# Patient Record
Sex: Female | Born: 1990 | Hispanic: Yes | Smoking: Never smoker
Health system: Southern US, Community
[De-identification: ages and names within clinical notes are randomized; demographics above are authoritative.]

## PROBLEM LIST (undated history)

## (undated) ENCOUNTER — Ambulatory Visit (HOSPITAL_COMMUNITY): Admission: EM | Payer: PRIVATE HEALTH INSURANCE

---

## 2016-01-13 ENCOUNTER — Ambulatory Visit (INDEPENDENT_AMBULATORY_CARE_PROVIDER_SITE_OTHER): Payer: PRIVATE HEALTH INSURANCE | Admitting: Family Medicine

## 2016-01-13 VITALS — BP 118/72 | HR 85 | Temp 98.1°F | Resp 18 | Ht 61.0 in | Wt 150.0 lb

## 2016-01-13 DIAGNOSIS — R300 Dysuria: Secondary | ICD-10-CM | POA: Diagnosis not present

## 2016-01-13 DIAGNOSIS — L298 Other pruritus: Secondary | ICD-10-CM

## 2016-01-13 DIAGNOSIS — N898 Other specified noninflammatory disorders of vagina: Secondary | ICD-10-CM

## 2016-01-13 DIAGNOSIS — Z7251 High risk heterosexual behavior: Secondary | ICD-10-CM | POA: Diagnosis not present

## 2016-01-13 DIAGNOSIS — Z113 Encounter for screening for infections with a predominantly sexual mode of transmission: Secondary | ICD-10-CM | POA: Diagnosis not present

## 2016-01-13 DIAGNOSIS — A599 Trichomoniasis, unspecified: Secondary | ICD-10-CM | POA: Diagnosis not present

## 2016-01-13 LAB — POCT URINALYSIS DIP (MANUAL ENTRY)
Bilirubin, UA: NEGATIVE
Glucose, UA: NEGATIVE
Ketones, POC UA: NEGATIVE
NITRITE UA: NEGATIVE
PROTEIN UA: NEGATIVE
Spec Grav, UA: >=1.03
UROBILINOGEN UA: 0.2
pH, UA: 5.5

## 2016-01-13 LAB — POC MICROSCOPIC URINALYSIS (UMFC): MUCUS RE: ABSENT

## 2016-01-13 LAB — POCT WET + KOH PREP
Yeast by KOH: ABSENT
Yeast by wet prep: ABSENT

## 2016-01-13 LAB — HIV ANTIBODY (ROUTINE TESTING W REFLEX): HIV 1&2 Ab, 4th Generation: NONREACTIVE

## 2016-01-13 LAB — HEPATITIS C ANTIBODY: HCV Ab: NEGATIVE

## 2016-01-13 LAB — HEPATITIS B SURFACE ANTIGEN: Hepatitis B Surface Ag: NEGATIVE

## 2016-01-13 LAB — HEPATITIS B SURFACE ANTIBODY, QUANTITATIVE: Hepatitis B-Post: 0.6 m[IU]/mL

## 2016-01-13 MED ORDER — NITROFURANTOIN MONOHYD MACRO 100 MG PO CAPS: 100.0000 mg | ORAL_CAPSULE | Freq: Two times a day (BID) | ORAL | Status: DC

## 2016-01-13 MED ORDER — METRONIDAZOLE 500 MG PO TABS: 2000.0000 mg | ORAL_TABLET | Freq: Once | ORAL | Status: DC

## 2016-01-13 NOTE — Progress Notes (Signed)
Pelvic exam and bimanual performed.  Normal female external genitalia without lesion.  Vaginal mucosa appear normal, moist and without lesions. Moderate amount of white discharge noted in vaginal canal. Cervix with no lesions, not friable. Wet prep and GC probed collected. No cervical motion tenderness, adnexal fullness or tenderness.  Benjiman CoreBrittany Joel Mericle, PA-C  [facility] 01/13/2016 11:28 AM

## 2016-01-13 NOTE — Patient Instructions (Addendum)
IF you received an x-ray today, you will receive an invoice from [facility]. Please contact [facility] at [phone] with questions or concerns regarding your invoice.   IF you received labwork today, you will receive an invoice from [facility]. Please contact [facility] at [phone] with questions or concerns regarding your invoice.   Our billing staff will not be able to assist you with questions regarding bills from these companies.  You will be contacted with the lab results as soon as they are available. The fastest way to get your results is to activate your My Chart account. Instructions are located on the last page of this paperwork. If you have not heard from us regarding the results in 2 weeks, please contact this office.     You do not appear to have a possible urinary tract infection, but the other test did indicate Trichomonas. I will treat for both at this time, but will check a urine culture to make sure. If the urine culture does not indicate an infection, you could stop the Macrobid. In the meantime, make sure your partner gets tested or treated for trichomonas, and we will let you know the results of the other STI testing as soon as we have them.  Return to the clinic or go to the nearest emergency room if any of your symptoms worsen or new symptoms occur.   Trichomoniasis Trichomoniasis is an infection caused by an organism called Trichomonas. The infection can affect both women and men. In women, the outer female genitalia and the vagina are affected. In men, the penis is mainly affected, but the prostate and other reproductive organs can also be involved. Trichomoniasis is a sexually transmitted infection (STI) and is most often passed to another person through sexual contact.  RISK FACTORS  Having unprotected sexual intercourse.  Having sexual intercourse with an infected partner. SIGNS AND SYMPTOMS  Symptoms of  trichomoniasis in women include:  Abnormal gray-green frothy vaginal discharge.  Itching and irritation of the vagina.  Itching and irritation of the area outside the vagina. Symptoms of trichomoniasis in men include:   Penile discharge with or without pain.  Pain during urination. This results from inflammation of the urethra. DIAGNOSIS  Trichomoniasis may be found during a Pap test or physical exam. Your health care provider may use one of the following methods to help diagnose this infection:  Testing the pH of the vagina with a test tape.  Using a vaginal swab test that checks for the Trichomonas organism. A test is available that provides results within a few minutes.  Examining a urine sample.  Testing vaginal secretions. Your health care provider may test you for other STIs, including HIV. TREATMENT   You may be given medicine to fight the infection. Women should inform their health care provider if they could be or are pregnant. Some medicines used to treat the infection should not be taken during pregnancy.  Your health care provider may recommend over-the-counter medicines or creams to decrease itching or irritation.  Your sexual partner will need to be treated if infected.  Your health care provider may test you for infection again 3 months after treatment. HOME CARE INSTRUCTIONS   Take medicines only as directed by your health care provider.  Take over-the-counter medicine for itching or irritation as directed by your health care provider.  Do not have sexual intercourse while you have the infection.  Women should not douche or wear tampons while they  have the infection.  Discuss your infection with your partner. Your partner may have gotten the infection from you, or you may have gotten it from your partner.  Have your sex partner get examined and treated if necessary.  Practice safe, informed, and protected sex.  See your health care provider for other  STI testing. SEEK MEDICAL CARE IF:   You still have symptoms after you finish your medicine.  You develop abdominal pain.  You have pain when you urinate.  You have bleeding after sexual intercourse.  You develop a rash.  Your medicine makes you sick or makes you throw up (vomit). MAKE SURE YOU:  Understand these instructions.  Will watch your condition.  Will get help right away if you are not doing well or get worse.   This information is not intended to replace advice given to you by your health care provider. Make sure you discuss any questions you have with your health care provider.   Document Released: 12/09/2000 Document Revised: 07/06/2014 Document Reviewed: 03/27/2013 Elsevier Interactive Patient Education 2016 [employer].  Urethritis, Adult Urethritis is an inflammation of the tube through which urine exits your bladder (urethra).  CAUSES Urethritis is often caused by an infection in your urethra. The infection can be viral, like herpes. The infection can also be bacterial, like gonorrhea. RISK FACTORS Risk factors of urethritis include:  Having sex without using a condom.  Having multiple sexual partners.  Having poor hygiene. SIGNS AND SYMPTOMS Symptoms of urethritis are less noticeable in women than in men. These symptoms include:  Burning feeling when you urinate (dysuria).  Discharge from your urethra.  Blood in your urine (hematuria).  Urinating more than usual. DIAGNOSIS  To confirm a diagnosis of urethritis, your health care provider will do the following:  Ask about your sexual history.  Perform a physical exam.  Have you provide a sample of your urine for lab testing.  Use a cotton swab to gently collect a sample from your urethra for lab testing. TREATMENT  It is important to treat urethritis. Depending on the cause, untreated urethritis may lead to serious genital infections and possibly infertility. Urethritis caused by a  bacterial infection is treated with antibiotic medicine. All sexual partners must be treated.  HOME CARE INSTRUCTIONS  Do not have sex until the test results are known and treatment is completed, even if your symptoms go away before you finish treatment.  If you were prescribed an antibiotic, finish it all even if you start to feel better. SEEK MEDICAL CARE IF:   Your symptoms are not improved in 3 days.  Your symptoms are getting worse.  You develop abdominal pain or pelvic pain (in women).  You develop joint pain.  You have a fever. SEEK IMMEDIATE MEDICAL CARE IF:   You have severe pain in the belly, back, or side.  You have repeated vomiting. MAKE SURE YOU:  Understand these instructions.  Will watch your condition.  Will get help right away if you are not doing well or get worse.   This information is not intended to replace advice given to you by your health care provider. Make sure you discuss any questions you have with your health care provider.   Document Released: 12/09/2000 Document Revised: 10/30/2014 Document Reviewed: 02/13/2013 Elsevier Interactive Patient Education [employer]

## 2016-01-13 NOTE — Progress Notes (Signed)
By signing my name below I, Shelah Lewandowsky, attest that this documentation has been prepared under the direction and in the presence of Shade Flood, MD. Electonically Signed. Shelah Lewandowsky, Scribe 01/13/2016 at 12:23 PM   Subjective:    Patient ID: Julie Levy, female    DOB: [birth year], 25 y.o.   MRN: [id number]  Chief Complaint  Patient presents with  . Vaginal Itching    x 2-3 days ago, had unprotected sex x 1 month ago, itchying worsen yesterday, would like STD testing     HPI Julie Levy is a 25 y.o. female who [facility] complaining of worsening vaginal itching for the past 3 days. Pt states the itching worsened to the point where it was waking her up last night from sleep. Pt also reports having mild vaginal discharge and thinks it is due to the fact that she is ovulating. Pt also reports burning with urination. Pt denies urinary frequency or urinary urgency. Pt reports having unprotected sex 1 month ago. Pt is requesting STD testing. Pt states her partner that she had unprotected sex with has not informed her of any dx of STIs. Pt reports being tested for STIs 2 months ago and was negative at that time.   Pt tried using ice to control vaginal itching with no relief. Pt also tried a vaginal cleanser last night and thinks it made the itching worse.   Pt denies any external rash, blister or bumps around vaginal area. Pt also denies any abd pain, fever or chills.   There are no active problems to display for this patient.  History reviewed. No pertinent past medical history. History reviewed. No pertinent past surgical history. No Known Allergies Prior to Admission medications   Not on File   Social History   Social History  . Marital Status: Single    Spouse Name: N/A  . Number of Children: N/A  . Years of Education: N/A   Occupational History  . Not on file.   Social History Main Topics  . Smoking status: Never Smoker   .  Smokeless tobacco: Not on file  . Alcohol Use: Not on file  . Drug Use: Not on file  . Sexual Activity: Not on file   Other Topics Concern  . Not on file   Social History Narrative  . No narrative on file      Review of Systems  Constitutional: Negative for fever and chills.  Gastrointestinal: Negative for abdominal pain.  Genitourinary: Positive for dysuria, vaginal discharge and vaginal pain (itching). Negative for vaginal bleeding.       Objective:   Physical Exam  Constitutional: She is oriented to person, place, and time. She appears well-developed and well-nourished.  HENT:  Head: Normocephalic and atraumatic.  Eyes: Conjunctivae and EOM are normal. Pupils are equal, round, and reactive to light.  Neck: Carotid bruit is not present.  Cardiovascular: Normal rate, regular rhythm, normal heart sounds and intact distal pulses.  Exam reveals no gallop and no friction rub.   No murmur heard. Pulmonary/Chest: Effort normal and breath sounds normal. She has no decreased breath sounds. She has no wheezes. She has no rhonchi. She has no rales.  Abdominal: Soft. Normal appearance and bowel sounds are normal. She exhibits no pulsatile midline mass. There is no tenderness. There is no rebound, no guarding and no CVA tenderness.  Neurological: She is alert and oriented to person, place, and time.  Skin: Skin is  warm and dry.  Psychiatric: She has a normal mood and affect. Her behavior is normal.  Vitals reviewed.    Filed Vitals:   01/13/16 1042  BP: 118/72  Pulse: 85  Temp: 98.1 F (36.7 C)  TempSrc: Oral  Resp: 18  Height: 5\' 1"  (1.549 m)  Weight: 150 lb (68.04 kg)  SpO2: 99%   Results for orders placed or performed in visit on 01/13/16  POCT urinalysis dipstick  Result Value Ref Range   Color, UA yellow yellow   Clarity, UA clear clear   Glucose, UA negative negative   Bilirubin, UA negative negative   Ketones, POC UA negative negative   Spec Grav, UA >=1.030     Blood, UA trace-lysed (A) negative   pH, UA 5.5    Protein Ur, POC negative negative   Urobilinogen, UA 0.2    Nitrite, UA Negative Negative   Leukocytes, UA moderate (2+) (A) Negative  POCT Microscopic Urinalysis ([facility])  Result Value Ref Range   WBC,UR,HPF,POC Many (A) None WBC/hpf   RBC,UR,HPF,POC None None RBC/hpf   Bacteria Few (A) None, Too numerous to count   Mucus Absent Absent   Epithelial Cells, UR Per Microscopy Few (A) None, Too numerous to count cells/hpf  POCT Wet + KOH Prep  Result Value Ref Range   Yeast by KOH Absent Present, Absent   Yeast by wet prep Absent Present, Absent   WBC by wet prep Too numerous to count  None, Few, Too numerous to count   Clue Cells Wet Prep HPF POC None None, Too numerous to count   Trich by wet prep Present Present, Absent   Bacteria Wet Prep HPF POC Moderate (A) None, Few, Too numerous to count   Epithelial Cells By [employer]et Pref ([facility]) Moderate (A) None, Few, Too numerous to count   RBC,UR,HPF,POC None None RBC/hpf        Assessment & Plan:   Julie Levy is a 25 y.o. female Vaginal itching - Plan: POCT urinalysis dipstick, POCT Microscopic Urinalysis ([facility]), POCT Wet + KOH Prep  History of unprotected sex - Plan: POCT Wet + KOH Prep, HIV antibody, RPR, Hepatitis B surface antibody, Hepatitis B surface antigen, Hepatitis C antibody, GC/Chlamydia Probe Amp  Dysuria - Plan: POCT Wet + KOH Prep, Urine culture, nitrofurantoin, macrocrystal-monohydrate, (MACROBID) 100 MG capsule  Routine screening for STI (sexually transmitted infection) - Plan: POCT Wet + KOH Prep, HIV antibody, RPR, Hepatitis B surface antibody, Hepatitis B surface antigen, Hepatitis C antibody, GC/Chlamydia Probe Amp  Trichomonas infection - Plan: metroNIDAZOLE (FLAGYL) 500 MG tablet  Trichomonas infection, suspected Trichomonas urethritis, but will also cover for other causes of UTI in the interim.   -Check urine culture  -start Flagyl 2000 mg 1, side effects  discussed. Discussed need for partner to be treated as well.   -Other STI testing performed as above. Safer sex practices discussed. RTC Precautions  Meds ordered this encounter  Medications  . nitrofurantoin, macrocrystal-monohydrate, (MACROBID) 100 MG capsule    Sig: Take 1 capsule (100 mg total) by mouth 2 (two) times daily.    Dispense:  14 capsule    Refill:  0  . metroNIDAZOLE (FLAGYL) 500 MG tablet    Sig: Take 4 tablets (2,000 mg total) by mouth once.    Dispense:  4 tablet    Refill:  0   Patient Instructions       IF you received an x-ray today, you will receive an invoice from [facility]. Please  contact [facility] at [phone] with questions or concerns regarding your invoice.   IF you received labwork today, you will receive an invoice from [facility]. Please contact [facility] at [phone] with questions or concerns regarding your invoice.   Our billing staff will not be able to assist you with questions regarding bills from these companies.  You will be contacted with the lab results as soon as they are available. The fastest way to get your results is to activate your My Chart account. Instructions are located on the last page of this paperwork. If you have not heard from [place] regarding the results in 2 weeks, please contact this office.     You do not appear to have a possible urinary tract infection, but the other test did indicate Trichomonas. I will treat for both at this time, but will check a urine culture to make sure. If the urine culture does not indicate an infection, you could stop the Macrobid. In the meantime, make sure your partner gets tested or treated for trichomonas, and we will let you know the results of the other STI testing as soon as we have them.  Return to the clinic or go to the nearest emergency room if any of your symptoms worsen or new symptoms occur.   Trichomoniasis Trichomoniasis is an  infection caused by an organism called Trichomonas. The infection can affect both women and men. In women, the outer female genitalia and the vagina are affected. In men, the penis is mainly affected, but the prostate and other reproductive organs can also be involved. Trichomoniasis is a sexually transmitted infection (STI) and is most often passed to another person through sexual contact.  RISK FACTORS  Having unprotected sexual intercourse.  Having sexual intercourse with an infected partner. SIGNS AND SYMPTOMS  Symptoms of trichomoniasis in women include:  Abnormal gray-green frothy vaginal discharge.  Itching and irritation of the vagina.  Itching and irritation of the area outside the vagina. Symptoms of trichomoniasis in men include:   Penile discharge with or without pain.  Pain during urination. This results from inflammation of the urethra. DIAGNOSIS  Trichomoniasis may be found during a Pap test or physical exam. Your health care provider may use one of the following methods to help diagnose this infection:  Testing the pH of the vagina with a test tape.  Using a vaginal swab test that checks for the Trichomonas organism. A test is available that provides results within a few minutes.  Examining a urine sample.  Testing vaginal secretions. Your health care provider may test you for other STIs, including HIV. TREATMENT   You may be given medicine to fight the infection. Women should inform their health care provider if they could be or are pregnant. Some medicines used to treat the infection should not be taken during pregnancy.  Your health care provider may recommend over-the-counter medicines or creams to decrease itching or irritation.  Your sexual partner will need to be treated if infected.  Your health care provider may test you for infection again 3 months after treatment. HOME CARE INSTRUCTIONS   Take medicines only as directed by your health care  provider.  Take over-the-counter medicine for itching or irritation as directed by your health care provider.  Do not have sexual intercourse while you have the infection.  Women should not douche or wear tampons while they have the infection.  Discuss your infection with your partner. Your partner may have gotten the infection  from you, or you may have gotten it from your partner.  Have your sex partner get examined and treated if necessary.  Practice safe, informed, and protected sex.  See your health care provider for other STI testing. SEEK MEDICAL CARE IF:   You still have symptoms after you finish your medicine.  You develop abdominal pain.  You have pain when you urinate.  You have bleeding after sexual intercourse.  You develop a rash.  Your medicine makes you sick or makes you throw up (vomit). MAKE SURE YOU:  Understand these instructions.  Will watch your condition.  Will get help right away if you are not doing well or get worse.   This information is not intended to replace advice given to you by your health care provider. Make sure you discuss any questions you have with your health care provider.   Document Released: 12/09/2000 Document Revised: 07/06/2014 Document Reviewed: 03/27/2013 Elsevier Interactive Patient Education 2016 [employer].  Urethritis, Adult Urethritis is an inflammation of the tube through which urine exits your bladder (urethra).  CAUSES Urethritis is often caused by an infection in your urethra. The infection can be viral, like herpes. The infection can also be bacterial, like gonorrhea. RISK FACTORS Risk factors of urethritis include:  Having sex without using a condom.  Having multiple sexual partners.  Having poor hygiene. SIGNS AND SYMPTOMS Symptoms of urethritis are less noticeable in women than in men. These symptoms include:  Burning feeling when you urinate (dysuria).  Discharge from your urethra.  Blood in  your urine (hematuria).  Urinating more than usual. DIAGNOSIS  To confirm a diagnosis of urethritis, your health care provider will do the following:  Ask about your sexual history.  Perform a physical exam.  Have you provide a sample of your urine for lab testing.  Use a cotton swab to gently collect a sample from your urethra for lab testing. TREATMENT  It is important to treat urethritis. Depending on the cause, untreated urethritis may lead to serious genital infections and possibly infertility. Urethritis caused by a bacterial infection is treated with antibiotic medicine. All sexual partners must be treated.  HOME CARE INSTRUCTIONS  Do not have sex until the test results are known and treatment is completed, even if your symptoms go away before you finish treatment.  If you were prescribed an antibiotic, finish it all even if you start to feel better. SEEK MEDICAL CARE IF:   Your symptoms are not improved in 3 days.  Your symptoms are getting worse.  You develop abdominal pain or pelvic pain (in women).  You develop joint pain.  You have a fever. SEEK IMMEDIATE MEDICAL CARE IF:   You have severe pain in the belly, back, or side.  You have repeated vomiting. MAKE SURE YOU:  Understand these instructions.  Will watch your condition.  Will get help right away if you are not doing well or get worse.   This information is not intended to replace advice given to you by your health care provider. Make sure you discuss any questions you have with your health care provider.   Document Released: 12/09/2000 Document Revised: 10/30/2014 Document Reviewed: 02/13/2013 Elsevier Interactive Patient Education [employer]     I personally performed the services described in this documentation, which was scribed in my presence. The recorded information has been reviewed and considered, and addended by me as needed.   Signed,   Meredith Staggers, MD [facility].  01/13/2016 1:32 PM

## 2016-01-14 LAB — GC/CHLAMYDIA PROBE AMP
CT Probe RNA: NOT DETECTED
GC Probe RNA: NOT DETECTED

## 2016-01-14 LAB — RPR

## 2016-01-15 LAB — URINE CULTURE: Organism ID, Bacteria: NO GROWTH

## 2016-07-08 ENCOUNTER — Encounter: Payer: Self-pay | Admitting: Physician Assistant

## 2016-07-08 ENCOUNTER — Ambulatory Visit (INDEPENDENT_AMBULATORY_CARE_PROVIDER_SITE_OTHER): Payer: PRIVATE HEALTH INSURANCE | Admitting: Physician Assistant

## 2016-07-08 VITALS — BP 110/86 | HR 100 | Temp 99.3°F | Resp 16 | Ht 61.0 in | Wt 148.6 lb

## 2016-07-08 DIAGNOSIS — R059 Cough, unspecified: Secondary | ICD-10-CM

## 2016-07-08 DIAGNOSIS — R067 Sneezing: Secondary | ICD-10-CM

## 2016-07-08 DIAGNOSIS — R05 Cough: Secondary | ICD-10-CM | POA: Diagnosis not present

## 2016-07-08 MED ORDER — FLUTICASONE PROPIONATE 50 MCG/ACT NA SUSP: 2.0000 | Freq: Every day | NASAL | 0 refills | Status: DC

## 2016-07-08 MED ORDER — HYDROCOD POLST-CPM POLST ER 10-8 MG/5ML PO SUER: 5.0000 mL | Freq: Two times a day (BID) | ORAL | 0 refills | Status: DC | PRN

## 2016-07-08 MED ORDER — BENZONATATE 100 MG PO CAPS: 100.0000 mg | ORAL_CAPSULE | Freq: Three times a day (TID) | ORAL | 0 refills | Status: DC | PRN

## 2016-07-08 NOTE — Progress Notes (Signed)
MRN: [id number] DOB: 1990-11-03  Subjective:   Julie Levy Huxtable is a 26 y.o. female presenting for chief complaint of Cough (symptoms started on Mon. x 3 days) and sneezing .  Reports 3 day history of rhinorrhea and cough (mostly dry, no hemoptysis), and intermittent chills. Has tried mucinex, dayquil, nyquil, and cough drops with mild relief. Denies fever, sinus congestion, itchy watery eyes, ear pain, sore throat, shortness of breath and chest pain, nausea, vomiting, abdominal pain and diarrhea. Has not had sick contact with anyone. No history of seasonal allergies or asthma. Patient has not had the flu shot this season. Denies smoking. Denies any other aggravating or relieving factors, no other questions or concerns.  [name] currently has no medications in their medication list. Also has No Known Allergies.  [name]  has no past medical history on file. Also  has no past surgical history on file.   Objective:   Vitals: BP 110/86   Pulse 100   Temp 99.3 F (37.4 C) (Oral)   Resp 16   Ht 5\' 1"  (1.549 m)   Wt 148 lb 9.6 oz (67.4 kg)   LMP 06/21/2016   SpO2 99%   BMI 28.08 kg/m   Physical Exam  Constitutional: She is oriented to person, place, and time. She appears well-developed and well-nourished.  HENT:  Head: Normocephalic and atraumatic.  Right Ear: Tympanic membrane, external ear and ear canal normal.  Left Ear: Tympanic membrane, external ear and ear canal normal.  Nose: Mucosal edema and rhinorrhea present. Right sinus exhibits no maxillary sinus tenderness and no frontal sinus tenderness. Left sinus exhibits no maxillary sinus tenderness and no frontal sinus tenderness.  Mouth/Throat: Uvula is midline, oropharynx is clear and moist and mucous membranes are normal.  Eyes: Conjunctivae are normal.  Neck: Normal range of motion.  Cardiovascular: Normal rate, regular rhythm and normal heart sounds.   Pulmonary/Chest: Effort normal. She has no wheezes. She has no rhonchi.  She has no rales.  Lymphadenopathy:       Head (right side): No submental, no submandibular, no tonsillar, no preauricular, no posterior auricular and no occipital adenopathy present.       Head (left side): No submental, no submandibular, no tonsillar, no preauricular, no posterior auricular and no occipital adenopathy present.    She has cervical adenopathy.       Right cervical: No superficial cervical, no deep cervical and no posterior cervical adenopathy present.      Left cervical: Posterior cervical adenopathy present. No superficial cervical and no deep cervical adenopathy present.       Right: No supraclavicular adenopathy present.       Left: No supraclavicular adenopathy present.  Neurological: She is alert and oriented to person, place, and time.  Skin: Skin is warm and dry.  Psychiatric: She has a normal mood and affect.  Vitals reviewed.   No results found for this or any previous visit (from the past 24 hour(s)).  Assessment and Plan :  1. Cough Likely viral etiology, will treat symptomatically. Pt instructed to return to clinic if symptoms worsen, do not improve in 5-7 days, or as needed - chlorpheniramine-HYDROcodone (TUSSIONEX PENNKINETIC ER) 10-8 MG/5ML SUER; Take 5 mLs by mouth every 12 (twelve) hours as needed for cough.  Dispense: 100 mL; Refill: 0 - benzonatate (TESSALON) 100 MG capsule; Take 1-2 capsules (100-200 mg total) by mouth 3 (three) times daily as needed for cough.  Dispense: 40 capsule; Refill: 0  2. Sneezing -Instructed to  pick up OTC 12 hr sudafed and zyrtec.  - fluticasone (FLONASE) 50 MCG/ACT nasal spray; Place 2 sprays into both nostrils daily.  Dispense: 16 g; Refill: 0  Benjiman Core, PA-C  [facility] 07/08/2016 2:15 PM

## 2016-07-08 NOTE — Patient Instructions (Addendum)
-   We will treat this as a respiratory viral infection.  - I recommend you rest, drink plenty of fluids, eat light meals including soups.  - You can use flonase and OTC 12 hr sudafed and zyrtec for nasal congestion and sneezing.  - You may use cough syrup at night for your cough, Tessalon pearls during the day. Be aware that cough syrup can definitely make you drowsy and sleepy so do not drive or operate any heavy machinery if it is affecting you during the day.  - Please let me know if you are not seeing any improvement or get worse in 5-7 days.     IF you received an x-ray today, you will receive an invoice from [facility]. Please contact [facility] at [phone] with questions or concerns regarding your invoice.   IF you received labwork today, you will receive an invoice from [facility]. Please contact [facility] at [phone] with questions or concerns regarding your invoice.   Our billing staff will not be able to assist you with questions regarding bills from these companies.  You will be contacted with the lab results as soon as they are available. The fastest way to get your results is to activate your My Chart account. Instructions are located on the last page of this paperwork. If you have not heard from us regarding the results in 2 weeks, please contact this office.

## 2017-01-25 ENCOUNTER — Ambulatory Visit (HOSPITAL_COMMUNITY)
Admission: EM | Admit: 2017-01-25 | Discharge: 2017-01-25 | Disposition: A | Discharge status: Home / Self Care | Payer: PRIVATE HEALTH INSURANCE | Attending: Family Medicine | Admitting: Family Medicine

## 2017-01-25 ENCOUNTER — Encounter (HOSPITAL_COMMUNITY): Payer: Self-pay | Admitting: Emergency Medicine

## 2017-01-25 DIAGNOSIS — W57XXXA Bitten or stung by nonvenomous insect and other nonvenomous arthropods, initial encounter: Secondary | ICD-10-CM

## 2017-01-25 DIAGNOSIS — S30860A Insect bite (nonvenomous) of lower back and pelvis, initial encounter: Secondary | ICD-10-CM | POA: Diagnosis not present

## 2017-01-25 DIAGNOSIS — S90861A Insect bite (nonvenomous), right foot, initial encounter: Secondary | ICD-10-CM | POA: Diagnosis not present

## 2017-01-25 DIAGNOSIS — S90862A Insect bite (nonvenomous), left foot, initial encounter: Secondary | ICD-10-CM

## 2017-01-25 MED ORDER — TRIAMCINOLONE ACETONIDE 0.1 % EX CREA: 1.0000 "application " | TOPICAL_CREAM | Freq: Two times a day (BID) | CUTANEOUS | 0 refills | Status: DC

## 2017-01-25 MED ORDER — CETIRIZINE HCL 10 MG PO TABS: 10.0000 mg | ORAL_TABLET | Freq: Every day | ORAL | 0 refills | Status: DC

## 2017-01-25 NOTE — ED Triage Notes (Signed)
Noticed insect bite on Friday.  Now bilateral ankles and feet as well as right flank are covered with insect bites.  These areas itch.

## 2017-01-25 NOTE — ED Provider Notes (Signed)
CSN: [id number]     Arrival date & time 01/25/17  1134 History   None    Chief Complaint  Patient presents with  . Insect Bite   (Consider location/radiation/quality/duration/timing/severity/associated sxs/prior Treatment) 26 year old female comes in for four-day history of insect bites. They're located on her right lower back, bilateral foot and shins. She has been putting Benadryl cream on it with some relief. Took one dose of Benadryl the day of onset with little relief. Denies fever, chills, night sweats. Denies increased erythema, warmth, and discharge. Denies swelling of the throat, trouble breathing, trouble swallowing.      History reviewed. No pertinent past medical history. History reviewed. No pertinent surgical history. Family History  Problem Relation Age of Onset  . Diabetes Father   . Hyperlipidemia Father    Social History  Substance Use Topics  . Smoking status: Never Smoker  . Smokeless tobacco: Never Used  . Alcohol use Yes   OB History    No data available     Review of Systems  Reason unable to perform ROS: See HPI as above.    Allergies  Patient has no known allergies.  Home Medications   Prior to Admission medications   Medication Sig Start Date End Date Taking? Authorizing Provider  diphenhydrAMINE (BENADRYL) 2 % cream Apply topically 3 (three) times daily as needed for itching.   Yes [provider]  cetirizine (ZYRTEC ALLERGY) 10 MG tablet Take 1 tablet (10 mg total) by mouth daily. 01/25/17   Cathie HoopsYu, Ying Blankenhorn V, PA-C  triamcinolone cream (KENALOG) 0.1 % Apply 1 application topically 2 (two) times daily. 01/25/17   Belinda FisherYu, Saesha Llerenas V, PA-C   Meds Ordered and Administered this Visit  Medications - No data to display  BP 116/69 (BP Location: Left Arm)   Pulse 90   Temp 98.8 F (37.1 C) (Oral)   Resp 14   LMP 01/10/2017   SpO2 100%  No data found.   Physical Exam  Constitutional: She is oriented to person, place, and time. She appears  well-developed and well-nourished. No distress.  HENT:  Head: Normocephalic and atraumatic.  Neurological: She is alert and oriented to person, place, and time.  Skin:  Bilateral foot and shin maculopapular rashes. Also seen on her right lower back. No surrounding erythema, increased warmth. Some rashes with skin opening due to scratching.   Psychiatric: She has a normal mood and affect. Her behavior is normal. Judgment normal.    Urgent Care Course     Procedures (including critical care time)  Labs Review Labs Reviewed - No data to display  Imaging Review No results found.       MDM   1. Insect bite, initial encounter    Discussed with patient exam without signs of skin infection today. Patient to refrain scratching/breaking skin. Take Zyrtec for itchiness. Triamcinolone cream for swelling and inflammation. Patient to monitor for source of insect bites, eliminate possible causes. Patient monitor for worsening of symptoms, fever, surrounding erythema, increased warmth, to follow-up for reevaluation.   Belinda FisherYu, Clare Fennimore V, PA-C 01/25/17 1242

## 2017-01-25 NOTE — Discharge Instructions (Signed)
Start zyrtec to help with itching. Triamcinolone cream to help with swelling/inflammation. Refrain from scratching/popping vesicles. Keep area clean and dry. Cold/warm showers may help with the itching. Monitor for source of insect bites and eliminate possible causes. Monitor for worsening of symptoms, fever, increased warmth/redness, follow up for reevaluation.

## 2017-04-23 ENCOUNTER — Encounter (HOSPITAL_COMMUNITY): Payer: Self-pay | Admitting: Physician Assistant

## 2017-04-23 ENCOUNTER — Ambulatory Visit (HOSPITAL_COMMUNITY)
Admission: EM | Admit: 2017-04-23 | Discharge: 2017-04-23 | Disposition: A | Discharge status: Home / Self Care | Payer: PRIVATE HEALTH INSURANCE | Attending: Emergency Medicine | Admitting: Emergency Medicine

## 2017-04-23 DIAGNOSIS — J020 Streptococcal pharyngitis: Secondary | ICD-10-CM | POA: Diagnosis not present

## 2017-04-23 LAB — POCT RAPID STREP A: Streptococcus, Group A Screen (Direct): POSITIVE — AB

## 2017-04-23 MED ORDER — ACETAMINOPHEN 325 MG PO TABS
650.0000 mg | ORAL_TABLET | Freq: Once | ORAL | Status: AC
  Administered 2017-04-23: 650 mg via ORAL

## 2017-04-23 MED ORDER — ACETAMINOPHEN 325 MG PO TABS
ORAL_TABLET | ORAL | Status: AC
  Filled 2017-04-23: qty 2

## 2017-04-23 MED ORDER — PENICILLIN V POTASSIUM 500 MG PO TABS: 500.0000 mg | ORAL_TABLET | Freq: Two times a day (BID) | ORAL | 0 refills | Status: AC

## 2017-04-23 NOTE — Discharge Instructions (Signed)
Rapid strep positive. Start Penicillin as directed. Tylenol/Motrin for fever and pain. Discard toothbrush after 24 hours on antibiotics. Monitor for any worsening of symptoms, trouble breathing, trouble swallowing, swelling of the throat, follow up here or at the emergency department for reevaluation.

## 2017-04-23 NOTE — ED Triage Notes (Signed)
Patient reports sore throat x 2 days. Has taken OTC meds to help with fever and generalized malaise.

## 2017-04-23 NOTE — ED Provider Notes (Signed)
[facility]    CSN: [id number] Arrival date & time: 04/23/17  1403     History   Chief Complaint Chief Complaint  Patient presents with  . Sore Throat  . Fever    HPI Julie Levy is a 26 y.o. female.   26 year old healthy female comes in with 2 day history of sore throat, subjective fever, generalized malaise. Minimal cough. Denies nasal congestion, rhinorrhea, ear pain, eye pain. Denies chest pain, shortness of breath, wheezing, weakness, dizziness. Dysphagia without trouble breathing/swallowing. Has been taking otc cold medication such as nyquil. Positive sick contact. Denies history of seasonal allergies. Never smoker.       History reviewed. No pertinent past medical history.  There are no active problems to display for this patient.   History reviewed. No pertinent surgical history.  OB History    No data available       Home Medications    Prior to Admission medications   Medication Sig Start Date End Date Taking? Authorizing Provider  cetirizine (ZYRTEC ALLERGY) 10 MG tablet Take 1 tablet (10 mg total) by mouth daily. 01/25/17   Cathie Hoops, Amy V, PA-C  penicillin v potassium (VEETID) 500 MG tablet Take 1 tablet (500 mg total) by mouth 2 (two) times daily. 04/23/17 05/03/17  Belinda Fisher, PA-C    Family History Family History  Problem Relation Age of Onset  . Diabetes Father   . Hyperlipidemia Father     Social History Social History  Substance Use Topics  . Smoking status: Never Smoker  . Smokeless tobacco: Never Used  . Alcohol use Yes     Allergies   Patient has no known allergies.   Review of Systems Review of Systems  Reason unable to perform ROS: See HPI as above.     Physical Exam Triage Vital Signs ED Triage Vitals [04/23/17 1427]  Enc Vitals Group     BP 124/83     Pulse Rate (!) 130     Resp 17     Temp (!) 100.4 F (38 C)     Temp Source Oral     SpO2 100 %     Weight      Height      Head Circumference    Peak Flow      Pain Score      Pain Loc      Pain Edu?      Excl. in GC?    No data found.   Updated Vital Signs BP 124/83 (BP Location: Right Arm)   Pulse (!) 121   Temp 100.2 F (37.9 C) (Oral)   Resp 18   SpO2 100%   Physical Exam  Constitutional: She is oriented to person, place, and time. She appears well-developed and well-nourished. No distress.  HENT:  Head: Normocephalic and atraumatic.  Right Ear: Tympanic membrane, external ear and ear canal normal. Tympanic membrane is not erythematous and not bulging.  Left Ear: Tympanic membrane, external ear and ear canal normal. Tympanic membrane is not erythematous and not bulging.  Nose: Mucosal edema and rhinorrhea present. Right sinus exhibits no maxillary sinus tenderness and no frontal sinus tenderness. Left sinus exhibits no maxillary sinus tenderness and no frontal sinus tenderness.  Mouth/Throat: Uvula is midline and mucous membranes are normal. Posterior oropharyngeal erythema present. Tonsils are 2+ on the right. Tonsils are 2+ on the left. No tonsillar exudate.  Eyes: Pupils are equal, round, and reactive to light. Conjunctivae are normal.  Neck: Normal range of motion. Neck supple.  Cardiovascular: Normal rate, regular rhythm and normal heart sounds.  Exam reveals no gallop and no friction rub.   No murmur heard. Pulmonary/Chest: Effort normal and breath sounds normal. She has no decreased breath sounds. She has no wheezes. She has no rhonchi. She has no rales.  Lymphadenopathy:    She has no cervical adenopathy.  Neurological: She is alert and oriented to person, place, and time.  Skin: Skin is warm and dry.  Psychiatric: She has a normal mood and affect. Her behavior is normal. Judgment normal.     UC Treatments / Results  Labs (all labs ordered are listed, but only abnormal results are displayed) Labs Reviewed  POCT RAPID STREP A - Abnormal; Notable for the following:       Result Value   Streptococcus,  Group A Screen (Direct) POSITIVE (*)    All other components within normal limits    EKG  EKG Interpretation None       Radiology No results found.  Procedures Procedures (including critical care time)  Medications Ordered in UC Medications  acetaminophen (TYLENOL) tablet 650 mg (650 mg Oral Given 04/23/17 1438)     Initial Impression / Assessment and Plan / UC Course  I have reviewed the triage vital signs and the nursing notes.  Pertinent labs & imaging results that were available during my care of the patient were reviewed by me and considered in my medical decision making (see chart for details).  Clinical Course as of Apr 23 1501  Fri Apr 23, 2017  1446 Will recheck vitals after tylenol/PO fluids  [AY]  1501 Recheck vitals improved. Discussed return precautions such as high fever, continued tachycardia, weakness, dizziness, confusion, to go to the emergency department for further evaluation   [AY]    Clinical Course User Index [AY] Cathie HoopsYu, Amy V, PA-C   Rapid strep positive. Start antibiotic as directed. Symptomatic treatment as needed. Return precautions given.   Final Clinical Impressions(s) / UC Diagnoses   Final diagnoses:  Strep pharyngitis    New Prescriptions New Prescriptions   PENICILLIN V POTASSIUM (VEETID) 500 MG TABLET    Take 1 tablet (500 mg total) by mouth 2 (two) times daily.       Belinda FisherYu, Amy V, PA-C 04/23/17 1502

## 2017-04-25 ENCOUNTER — Emergency Department (HOSPITAL_COMMUNITY): Payer: PRIVATE HEALTH INSURANCE

## 2017-04-25 ENCOUNTER — Encounter (HOSPITAL_COMMUNITY): Payer: Self-pay | Admitting: Emergency Medicine

## 2017-04-25 DIAGNOSIS — J039 Acute tonsillitis, unspecified: Secondary | ICD-10-CM | POA: Insufficient documentation

## 2017-04-25 DIAGNOSIS — R05 Cough: Secondary | ICD-10-CM | POA: Insufficient documentation

## 2017-04-25 DIAGNOSIS — Z79899 Other long term (current) drug therapy: Secondary | ICD-10-CM | POA: Diagnosis not present

## 2017-04-25 DIAGNOSIS — J3489 Other specified disorders of nose and nasal sinuses: Secondary | ICD-10-CM | POA: Insufficient documentation

## 2017-04-25 DIAGNOSIS — J029 Acute pharyngitis, unspecified: Secondary | ICD-10-CM | POA: Diagnosis present

## 2017-04-25 LAB — COMPREHENSIVE METABOLIC PANEL
ALBUMIN: 4.2 g/dL (ref 3.5–5.0)
ALT: 17 U/L (ref 14–54)
ANION GAP: 11 (ref 5–15)
AST: 25 U/L (ref 15–41)
Alkaline Phosphatase: 67 U/L (ref 38–126)
BILIRUBIN TOTAL: 0.3 mg/dL (ref 0.3–1.2)
BUN: 7 mg/dL (ref 6–20)
CALCIUM: 9.2 mg/dL (ref 8.9–10.3)
CO2: 23 mmol/L (ref 22–32)
CREATININE: 0.73 mg/dL (ref 0.44–1.00)
Chloride: 103 mmol/L (ref 101–111)
GFR calc non Af Amer: >60 mL/min (ref >60)
GLUCOSE: 102 mg/dL — AB (ref 65–99)
Potassium: 3.5 mmol/L (ref 3.5–5.1)
SODIUM: 137 mmol/L (ref 135–145)
TOTAL PROTEIN: 8 g/dL (ref 6.5–8.1)

## 2017-04-25 LAB — URINALYSIS, ROUTINE W REFLEX MICROSCOPIC
Bilirubin Urine: NEGATIVE
Glucose, UA: NEGATIVE mg/dL
Ketones, ur: 20 mg/dL — AB
Nitrite: NEGATIVE
PH: 5 (ref 5.0–8.0)
Protein, ur: 30 mg/dL — AB
SPECIFIC GRAVITY, URINE: 1.011 (ref 1.005–1.030)

## 2017-04-25 LAB — CBC WITH DIFFERENTIAL/PLATELET
BASOS PCT: 0 %
Basophils Absolute: 0 10*3/uL (ref 0.0–0.1)
EOS ABS: 0 10*3/uL (ref 0.0–0.7)
EOS PCT: 0 %
HCT: 39.8 % (ref 36.0–46.0)
Hemoglobin: 12.8 g/dL (ref 12.0–15.0)
LYMPHS ABS: 1.8 10*3/uL (ref 0.7–4.0)
Lymphocytes Relative: 22 %
MCH: 27.4 pg (ref 26.0–34.0)
MCHC: 32.2 g/dL (ref 30.0–36.0)
MCV: 85.2 fL (ref 78.0–100.0)
MONOS PCT: 16 %
Monocytes Absolute: 1.3 10*3/uL — ABNORMAL HIGH (ref 0.1–1.0)
NEUTROS PCT: 62 %
Neutro Abs: 5.1 10*3/uL (ref 1.7–7.7)
PLATELETS: 197 10*3/uL (ref 150–400)
RBC: 4.67 MIL/uL (ref 3.87–5.11)
RDW: 13 % (ref 11.5–15.5)
WBC: 8.1 10*3/uL (ref 4.0–10.5)

## 2017-04-25 LAB — I-STAT CG4 LACTIC ACID, ED: Lactic Acid, Venous: 1.75 mmol/L (ref 0.5–1.9)

## 2017-04-25 LAB — RAPID STREP SCREEN (MED CTR MEBANE ONLY): STREPTOCOCCUS, GROUP A SCREEN (DIRECT): NEGATIVE

## 2017-04-25 LAB — PROTIME-INR
INR: 0.93
PROTHROMBIN TIME: 12.4 s (ref 11.4–15.2)

## 2017-04-25 MED ORDER — ACETAMINOPHEN 325 MG PO TABS
650.0000 mg | ORAL_TABLET | Freq: Once | ORAL | Status: AC | PRN
  Administered 2017-04-25: 650 mg via ORAL

## 2017-04-25 MED ORDER — ACETAMINOPHEN 325 MG PO TABS
ORAL_TABLET | ORAL | Status: AC
  Filled 2017-04-25: qty 2

## 2017-04-25 NOTE — ED Triage Notes (Signed)
Pt c/o 8/10 sore throat and bilateral ear pain. Pt was seen on UC 2 days ago diagnosed with strep throat and sent home on ABX, pt now having fever, HR 140 and ear pain. ABX taken as prescribed.

## 2017-04-26 ENCOUNTER — Emergency Department (HOSPITAL_COMMUNITY)
Admission: EM | Admit: 2017-04-26 | Discharge: 2017-04-26 | Disposition: A | Discharge status: Home / Self Care | Payer: PRIVATE HEALTH INSURANCE | Attending: Emergency Medicine | Admitting: Emergency Medicine

## 2017-04-26 DIAGNOSIS — J039 Acute tonsillitis, unspecified: Secondary | ICD-10-CM

## 2017-04-26 MED ORDER — CLINDAMYCIN HCL 300 MG PO CAPS: 300.0000 mg | ORAL_CAPSULE | Freq: Three times a day (TID) | ORAL | 0 refills | Status: DC

## 2017-04-26 MED ORDER — SODIUM CHLORIDE 0.9 % IV BOLUS (SEPSIS)
1000.0000 mL | Freq: Once | INTRAVENOUS | Status: AC
  Administered 2017-04-26: 1000 mL via INTRAVENOUS

## 2017-04-26 MED ORDER — CLINDAMYCIN PHOSPHATE 900 MG/50ML IV SOLN
900.0000 mg | Freq: Once | INTRAVENOUS | Status: AC
  Administered 2017-04-26: 900 mg via INTRAVENOUS
  Filled 2017-04-26: qty 50

## 2017-04-26 MED ORDER — DEXAMETHASONE SODIUM PHOSPHATE 10 MG/ML IJ SOLN
10.0000 mg | Freq: Once | INTRAMUSCULAR | Status: AC
  Administered 2017-04-26: 10 mg via INTRAVENOUS
  Filled 2017-04-26: qty 1

## 2017-04-26 NOTE — ED Provider Notes (Signed)
[facility] EMERGENCY DEPARTMENT Provider Note   CSN: [id number] Arrival date & time: 04/25/17  2205  Time seen 01:25 AM  History   Chief Complaint Chief Complaint  Patient presents with  . Sore Throat  . Otalgia    HPI Julie Levy is a 26 y.o. female.  HPI patient states she started getting a sore throat on October 26.  She was seen at the urgent care and had a positive strep screen.  She was started on Pen-Vee K 500 mg twice a day.  She states she is not better.  She denies fever or chills.  She states she has a cough that is mainly dry although rarely coughs up clear mucus, she has some mild clear rhinorrhea without sneezing.  She also complains of bilateral ear pain.  She states her pain in her throat is the same on either side.  She is able to swallow fluids and states she is able to swallow soups.  PCP OOT  History reviewed. No pertinent past medical history.  There are no active problems to display for this patient.   History reviewed. No pertinent surgical history.  OB History    No data available       Home Medications    Prior to Admission medications   Medication Sig Start Date End Date Taking? Authorizing Provider  penicillin v potassium (VEETID) 500 MG tablet Take 1 tablet (500 mg total) by mouth 2 (two) times daily. 04/23/17 05/03/17 Yes Yu, Amy V, PA-C  clindamycin (CLEOCIN) 300 MG capsule Take 1 capsule (300 mg total) by mouth 3 (three) times daily. 04/26/17   Devoria Albe, MD    Family History Family History  Problem Relation Age of Onset  . Diabetes Father   . Hyperlipidemia Father     Social History Social History  Substance Use Topics  . Smoking status: Never Smoker  . Smokeless tobacco: Never Used  . 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   Review of Systems Review of Systems  All other systems reviewed and are negative.    Physical Exam Updated  Vital Signs BP 113/82   Pulse 96   Temp (!) 101.1 F (38.4 C) (Oral)   Resp (!) 24   Ht 5\' 1"  (1.549 m)   Wt 68 kg (150 lb)   LMP 04/25/2017   SpO2 99%   BMI 28.34 kg/m   Vital signs normal except for fever   Physical Exam  Constitutional: She is oriented to person, place, and time. She appears well-developed and well-nourished.  Non-toxic appearance. She does not appear ill. No distress.  HENT:  Head: Normocephalic and atraumatic.  Right Ear: Tympanic membrane, external ear and ear canal normal.  Left Ear: Tympanic membrane, external ear and ear canal normal.  Nose: Nose normal. No mucosal edema or rhinorrhea.  Mouth/Throat: Mucous membranes are dry. No dental abscesses or uvula swelling.  Speech is normal, no drooling, she has minor redness of her tonsils which are not enlarged. No soft palate swelling.  Eyes: Pupils are equal, round, and reactive to light. Conjunctivae and EOM are normal.  Neck: Normal range of motion and full passive range of motion without pain. Neck supple.  Cardiovascular: Normal rate, regular rhythm and normal heart sounds.  Exam reveals no gallop and no friction rub.   No murmur heard. Pulmonary/Chest: Effort normal and breath sounds normal. No respiratory distress. She has no wheezes.  She has no rhonchi. She has no rales. She exhibits no tenderness and no crepitus.  Abdominal: Soft. Normal appearance and bowel sounds are normal. She exhibits no distension. There is no tenderness. There is no rebound and no guarding.  Musculoskeletal: Normal range of motion. She exhibits no edema or tenderness.  Moves all extremities well.   Neurological: She is alert and oriented to person, place, and time. She has normal strength. No cranial nerve deficit.  Skin: Skin is warm, dry and intact. No rash noted. No erythema. No pallor.  Psychiatric: She has a normal mood and affect. Her speech is normal and behavior is normal. Her mood appears not anxious.  Nursing note and  vitals reviewed.    ED Treatments / Results  Labs (all labs ordered are listed, but only abnormal results are displayed) Results for orders placed or performed during the hospital encounter of 04/26/17  Rapid strep screen  Result Value Ref Range   Streptococcus, Group A Screen (Direct) NEGATIVE NEGATIVE  Comprehensive metabolic panel  Result Value Ref Range   Sodium 137 135 - 145 mmol/L   Potassium 3.5 3.5 - 5.1 mmol/L   Chloride 103 101 - 111 mmol/L   CO2 23 22 - 32 mmol/L   Glucose, Bld 102 (H) 65 - 99 mg/dL   BUN 7 6 - 20 mg/dL   Creatinine, Ser 1.190.73 0.44 - 1.00 mg/dL   Calcium 9.2 8.9 - 14.710.3 mg/dL   Total Protein 8.0 6.5 - 8.1 g/dL   Albumin 4.2 3.5 - 5.0 g/dL   AST 25 15 - 41 U/L   ALT 17 14 - 54 U/L   Alkaline Phosphatase 67 38 - 126 U/L   Total Bilirubin 0.3 0.3 - 1.2 mg/dL   GFR calc non Af Amer >60 >60 mL/min   GFR calc Af Amer >60 >60 mL/min   Anion gap 11 5 - 15  CBC with Differential  Result Value Ref Range   WBC 8.1 4.0 - 10.5 K/uL   RBC 4.67 3.87 - 5.11 MIL/uL   Hemoglobin 12.8 12.0 - 15.0 g/dL   HCT 82.939.8 56.236.0 - 13.046.0 %   MCV 85.2 78.0 - 100.0 fL   MCH 27.4 26.0 - 34.0 pg   MCHC 32.2 30.0 - 36.0 g/dL   RDW 86.513.0 78.411.5 - 69.615.5 %   Platelets 197 150 - 400 K/uL   Neutrophils Relative % 62 %   Neutro Abs 5.1 1.7 - 7.7 K/uL   Lymphocytes Relative 22 %   Lymphs Abs 1.8 0.7 - 4.0 K/uL   Monocytes Relative 16 %   Monocytes Absolute 1.3 (H) 0.1 - 1.0 K/uL   Eosinophils Relative 0 %   Eosinophils Absolute 0.0 0.0 - 0.7 K/uL   Basophils Relative 0 %   Basophils Absolute 0.0 0.0 - 0.1 K/uL  Protime-INR  Result Value Ref Range   Prothrombin Time 12.4 11.4 - 15.2 seconds   INR 0.93   Urinalysis, Routine w reflex microscopic  Result Value Ref Range   Color, Urine YELLOW YELLOW   APPearance CLEAR CLEAR   Specific Gravity, Urine 1.011 1.005 - 1.030   pH 5.0 5.0 - 8.0   Glucose, UA NEGATIVE NEGATIVE mg/dL   Hgb urine dipstick LARGE (A) NEGATIVE   Bilirubin Urine  NEGATIVE NEGATIVE   Ketones, ur 20 (A) NEGATIVE mg/dL   Protein, ur 30 (A) NEGATIVE mg/dL   Nitrite NEGATIVE NEGATIVE   Leukocytes, UA SMALL (A) NEGATIVE   RBC / HPF TOO NUMEROUS  TO COUNT 0 - 5 RBC/hpf   WBC, UA 6-30 0 - 5 WBC/hpf   Bacteria, UA RARE (A) NONE SEEN   Squamous Epithelial / LPF 0-5 (A) NONE SEEN   Mucus PRESENT   I-Stat CG4 Lactic Acid, ED  Result Value Ref Range   Lactic Acid, Venous 1.75 0.5 - 1.9 mmol/L   Laboratory interpretation all normal except hematuria (on menses), strep is now negative    EKG  EKG Interpretation None       Radiology Dg Chest 2 View  Result Date: 04/25/2017 CLINICAL DATA:  Possible sepsis. Recent diagnosis of strep throat. Fever and sore throat. EXAM: CHEST  2 VIEW COMPARISON:  None. FINDINGS: Low lung volumes. The cardiomediastinal contours are normal. The lungs are clear. Pulmonary vasculature is normal. No consolidation, pleural effusion, or pneumothorax. No acute osseous abnormalities are seen. Gaseous gastric distention incidentally noted in the upper abdomen. IMPRESSION: Low lung volumes without acute chest finding. Electronically Signed   By: Rubye Oaks M.D.   On: 04/25/2017 23:02    Procedures Procedures (including critical care time)  Medications Ordered in ED Medications  acetaminophen (TYLENOL) 325 MG tablet (not administered)  acetaminophen (TYLENOL) tablet 650 mg (650 mg Oral Given 04/25/17 2220)  sodium chloride 0.9 % bolus 1,000 mL (0 mLs Intravenous Stopped 04/26/17 0259)  sodium chloride 0.9 % bolus 1,000 mL (0 mLs Intravenous Stopped 04/26/17 0407)  clindamycin (CLEOCIN) IVPB 900 mg (0 mg Intravenous Stopped 04/26/17 0228)  dexamethasone (DECADRON) injection 10 mg (10 mg Intravenous Given 04/26/17 0147)     Initial Impression / Assessment and Plan / ED Course  I have reviewed the triage vital signs and the nursing notes.  Pertinent labs & imaging results that were available during my care of the patient  were reviewed by me and considered in my medical decision making (see chart for details).      We discussed her ear pain was probably referred pain from her tonsillitis.  Patient strep screen has gone from positive to negative with just the penicillin.  However she states she does not feel better so her antibiotics will be changed.  Patient was given IV fluids, her fever was treated with Tylenol.  She was given Decadron IV and 1 dose of clindamycin IV.  Recheck at time of discharge, pt is feeling better, has had urine output. When I enter the room she is drinking a milkshake in no distress.   Final Clinical Impressions(s) / ED Diagnoses   Final diagnoses:  Tonsillitis    New Prescriptions New Prescriptions   CLINDAMYCIN (CLEOCIN) 300 MG CAPSULE    Take 1 capsule (300 mg total) by mouth 3 (three) times daily.    Plan discharge  Devoria Albe, MD, Concha Pyo, MD 04/26/17 Jeralyn Bennett

## 2017-04-26 NOTE — Discharge Instructions (Signed)
Drink plenty of fluids. Take ibuprofen 600 mg + acetaminophen 650 mg every 6 hrs for fever as needed (you can take children's liquid if having trouble taking pills). Take the antibiotic until gone. Recheck if you are unable to swallow of if you seem worse.

## 2017-04-28 LAB — CULTURE, GROUP A STREP (THRC)

## 2017-05-01 LAB — CULTURE, BLOOD (ROUTINE X 2)
CULTURE: NO GROWTH
Culture: NO GROWTH
SPECIAL REQUESTS: ADEQUATE

## 2017-09-10 ENCOUNTER — Ambulatory Visit: Payer: PRIVATE HEALTH INSURANCE | Admitting: Psychology

## 2017-10-06 ENCOUNTER — Encounter: Payer: Self-pay | Admitting: Family Medicine

## 2017-10-06 ENCOUNTER — Other Ambulatory Visit: Payer: Self-pay

## 2017-10-06 ENCOUNTER — Ambulatory Visit: Payer: PRIVATE HEALTH INSURANCE | Admitting: Family Medicine

## 2017-10-06 ENCOUNTER — Ambulatory Visit: Payer: Self-pay | Admitting: *Deleted

## 2017-10-06 VITALS — BP 124/85 | HR 92 | Temp 98.9°F | Resp 17 | Ht 61.0 in | Wt 159.2 lb

## 2017-10-06 DIAGNOSIS — R0789 Other chest pain: Secondary | ICD-10-CM

## 2017-10-06 MED ORDER — MELOXICAM 7.5 MG PO TABS: 7.5000 mg | ORAL_TABLET | Freq: Every day | ORAL | 0 refills | Status: DC

## 2017-10-06 NOTE — Progress Notes (Signed)
Chief Complaint  Patient presents with  . pressure in chest since last week    dull pain, pain level 2/10, regular bm's, appetite normal.  No dizziness    HPI   Pt reports that since Wednesday a week ago She states that she started working out consistently a month ago and has not noted any changes to the chest pain. She does not think the two are related.   No aggravating factors - no DOE, no worsening with eating, drinking, activity, sleeping or movement Non-radiating Lasts a few minutes at a time Onset a week ago She has tried ibuprofen the other day which took some of the pain away  She reports that her father had a brain aneurysm  Her family history of only significant for diabetes She is a nonsmoker She is not on hormonal contraceptive.     History reviewed. No pertinent past medical history.  Current Outpatient Medications  Medication Sig Dispense Refill  . meloxicam (MOBIC) 7.5 MG tablet Take 1 tablet (7.5 mg total) by mouth daily. 30 tablet 0   No current facility-administered medications for this visit.     Allergies: No Known Allergies  History reviewed. No pertinent surgical history.  Social History   Socioeconomic History  . Marital status: Single    Spouse name: Not on file  . Number of children: Not on file  . Years of education: Not on file  . Highest education level: Not on file  Occupational History  . Not on file  Social Needs  . Financial resource strain: Not on file  . Food insecurity:    Worry: Not on file    Inability: Not on file  . Transportation needs:    Medical: Not on file    Non-medical: Not on file  Tobacco Use  . Smoking status: Never Smoker  . Smokeless tobacco: Never Used  Substance and Sexual Activity  . Alcohol use: Yes  . Drug use: No  . Sexual activity: Not on file  Lifestyle  . Physical activity:    Days per week: Not on file    Minutes per session: Not on file  . Stress: Not on file  Relationships  . Social  connections:    Talks on phone: Not on file    Gets together: Not on file    Attends religious service: Not on file    Active member of club or organization: Not on file    Attends meetings of clubs or organizations: Not on file    Relationship status: Not on file  Other Topics Concern  . Not on file  Social History Narrative  . Not on file    Family History  Problem Relation Age of Onset  . Diabetes Father   . Hyperlipidemia Father      ROS Review of Systems See HPI Constitution: No fevers or chills No malaise No diaphoresis Skin: No rash or itching Eyes: no blurry vision, no double vision GU: no dysuria or hematuria Neuro: no dizziness or headaches all others reviewed and negative   Objective: Vitals:   10/06/17 1354  BP: 124/85  Pulse: 92  Resp: 17  Temp: 98.9 F (37.2 C)  TempSrc: Oral  SpO2: 97%  Weight: 159 lb 3.2 oz (72.2 kg)  Height: 5\' 1"  (1.549 m)    Physical Exam  Constitutional: She is oriented to person, place, and time. She appears well-developed and well-nourished.  HENT:  Head: Normocephalic.  Eyes: Conjunctivae and EOM are normal.  Cardiovascular:  Normal rate, regular rhythm and normal heart sounds.  No murmur heard. Pulmonary/Chest: Effort normal and breath sounds normal. No stridor. No respiratory distress. She exhibits no mass, no tenderness, no laceration, no crepitus, no edema, no deformity, no swelling and no retraction.  Musculoskeletal: Normal range of motion. She exhibits no edema.  Neurological: She is alert and oriented to person, place, and time.  Skin: Skin is warm. Capillary refill takes less than 2 seconds.  Psychiatric: She has a normal mood and affect. Her behavior is normal. Judgment and thought content normal.   ECG - nsr, no t wave changes  Assessment and Plan Julie Levy was seen today for pressure in chest since last week.  Diagnoses and all orders for this visit:  Left chest pressure -     POCT urinalysis  dipstick -     EKG 12-Lead -     CBC -     Hemoglobin A1c -     Comprehensive metabolic panel -     TSH -     Holter monitor - 48 hour; Future -     meloxicam (MOBIC) 7.5 MG tablet; Take 1 tablet (7.5 mg total) by mouth daily.   Will check labs Will screen for diabetes Pt to get a holter monitor for screening for underlying dysrhythmia  Kyair Ditommaso A Creta Levin

## 2017-10-06 NOTE — Telephone Encounter (Signed)
Pt having pressure on the left side of chest since last Wednesday. She denies cardiac symptoms or fever. Appointment made per protocol with a provider today. Home care advice given with verbal understanding.  Reason for Disposition . [1] Chest pain(s) lasting a few seconds AND [2] persists > 3 days  Answer Assessment - Initial Assessment Questions 1. LOCATION: "Where does it hurt?"       On the left side of chest 2. RADIATION: "Does the pain go anywhere else?" (e.g., into neck, jaw, arms, back)     Does not 3. ONSET: "When did the chest pain begin?" (Minutes, hours or days)      A week ago 4. PATTERN "Does the pain come and go, or has it been constant since it started?"  "Does it get worse with exertion?"      Comes and goes 5. DURATION: "How long does it last" (e.g., seconds, minutes, hours)     A few minustes 6. SEVERITY: "How bad is the pain?"  (e.g., Scale 1-10; mild, moderate, or severe)    - MILD (1-3): doesn't interfere with normal activities     - MODERATE (4-7): interferes with normal activities or awakens from sleep    - SEVERE (8-10): excruciating pain, unable to do any normal activities       Pain #2 7. CARDIAC RISK FACTORS: "Do you have any history of heart problems or risk factors for heart disease?" (e.g., prior heart attack, angina; high blood pressure, diabetes, being overweight, high cholesterol, smoking, or strong family history of heart disease)     no 8. PULMONARY RISK FACTORS: "Do you have any history of lung disease?"  (e.g., blood clots in lung, asthma, emphysema, birth control pills)     no 9. CAUSE: "What do you think is causing the chest pain?"     Not sure 10. OTHER SYMPTOMS: "Do you have any other symptoms?" (e.g., dizziness, nausea, vomiting, sweating, fever, difficulty breathing, cough)       no 11. PREGNANCY: "Is there any chance you are pregnant?" "When was your last menstrual period?"       Just had period  Protocols used: CHEST PAIN-A-AH

## 2017-10-06 NOTE — Patient Instructions (Addendum)
     IF you received an x-ray today, you will receive an invoice from [facility]. Please contact [facility] at [phone] with questions or concerns regarding your invoice.   IF you received labwork today, you will receive an invoice from [facility]. Please contact [facility] at [phone] with questions or concerns regarding your invoice.   Our billing staff will not be able to assist you with questions regarding bills from these companies.  You will be contacted with the lab results as soon as they are available. The fastest way to get your results is to activate your My Chart account. Instructions are located on the last page of this paperwork. If you have not heard from us regarding the results in 2 weeks, please contact this office.        IF you received an x-ray today, you will receive an invoice from [facility]. Please contact [facility] at [phone] with questions or concerns regarding your invoice.   IF you received labwork today, you will receive an invoice from [facility]. Please contact [facility] at [phone] with questions or concerns regarding your invoice.   Our billing staff will not be able to assist you with questions regarding bills from these companies.  You will be contacted with the lab results as soon as they are available. The fastest way to get your results is to activate your My Chart account. Instructions are located on the last page of this paperwork. If you have not heard from us regarding the results in 2 weeks, please contact this office.     Chest Wall Pain Chest wall pain is pain in or around the bones and muscles of your chest. Sometimes, an injury causes this pain. Sometimes, the cause may not be known. This pain may take several weeks or longer to get better. Follow these instructions at home: Pay attention to any changes in your symptoms. Take these actions to help with your pain:  Rest as told by your  health care provider.  Avoid activities that cause pain. These include any activities that use your chest muscles or your abdominal and side muscles to lift heavy items.  If directed, apply ice to the painful area: ? Put ice in a plastic bag. ? Place a towel between your skin and the bag. ? Leave the ice on for 20 minutes, 2-3 times per day.  Take over-the-counter and prescription medicines only as told by your health care provider.  Do not use tobacco products, including cigarettes, chewing tobacco, and e-cigarettes. If you need help quitting, ask your health care provider.  Keep all follow-up visits as told by your health care provider. This is important.  Contact a health care provider if:  You have a fever.  Your chest pain becomes worse.  You have new symptoms. Get help right away if:  You have nausea or vomiting.  You feel sweaty or light-headed.  You have a cough with phlegm (sputum) or you cough up blood.  You develop shortness of breath. This information is not intended to replace advice given to you by your health care provider. Make sure you discuss any questions you have with your health care provider. Document Released: 06/15/2005 Document Revised: 10/24/2015 Document Reviewed: 09/10/2014 Elsevier Interactive Patient Education  [employer]2018 [employer]

## 2017-10-07 ENCOUNTER — Encounter: Payer: Self-pay | Admitting: Radiology

## 2017-10-07 LAB — COMPREHENSIVE METABOLIC PANEL
A/G RATIO: 1.5 (ref 1.2–2.2)
ALBUMIN: 4.5 g/dL (ref 3.5–5.5)
ALT: 19 IU/L (ref 0–32)
AST: 26 IU/L (ref 0–40)
Alkaline Phosphatase: 83 IU/L (ref 39–117)
BUN / CREAT RATIO: 12 (ref 9–23)
BUN: 8 mg/dL (ref 6–20)
Bilirubin Total: 0.2 mg/dL (ref 0.0–1.2)
CHLORIDE: 103 mmol/L (ref 96–106)
CO2: 23 mmol/L (ref 20–29)
Calcium: 9.9 mg/dL (ref 8.7–10.2)
Creatinine, Ser: 0.66 mg/dL (ref 0.57–1.00)
GFR, EST AFRICAN AMERICAN: 143 mL/min/{1.73_m2} (ref >59)
GFR, EST NON AFRICAN AMERICAN: 124 mL/min/{1.73_m2} (ref >59)
GLOBULIN, TOTAL: 3.1 g/dL (ref 1.5–4.5)
Glucose: 90 mg/dL (ref 65–99)
POTASSIUM: 4.8 mmol/L (ref 3.5–5.2)
Sodium: 141 mmol/L (ref 134–144)
TOTAL PROTEIN: 7.6 g/dL (ref 6.0–8.5)

## 2017-10-07 LAB — CBC
HEMATOCRIT: 37.8 % (ref 34.0–46.6)
Hemoglobin: 12.2 g/dL (ref 11.1–15.9)
MCH: 26.6 pg (ref 26.6–33.0)
MCHC: 32.3 g/dL (ref 31.5–35.7)
MCV: 83 fL (ref 79–97)
Platelets: 261 10*3/uL (ref 150–379)
RBC: 4.58 x10E6/uL (ref 3.77–5.28)
RDW: 14 % (ref 12.3–15.4)
WBC: 9.1 10*3/uL (ref 3.4–10.8)

## 2017-10-07 LAB — HEMOGLOBIN A1C
ESTIMATED AVERAGE GLUCOSE: 108 mg/dL
Hgb A1c MFr Bld: 5.4 % (ref 4.8–5.6)

## 2017-10-07 LAB — TSH: TSH: 2.45 u[IU]/mL (ref 0.450–4.500)

## 2017-12-31 ENCOUNTER — Encounter (HOSPITAL_COMMUNITY): Payer: Self-pay | Admitting: Emergency Medicine

## 2017-12-31 ENCOUNTER — Ambulatory Visit (HOSPITAL_COMMUNITY)
Admission: EM | Admit: 2017-12-31 | Discharge: 2017-12-31 | Disposition: A | Discharge status: Home / Self Care | Payer: PRIVATE HEALTH INSURANCE | Attending: Family Medicine | Admitting: Family Medicine

## 2017-12-31 ENCOUNTER — Other Ambulatory Visit: Payer: Self-pay

## 2017-12-31 DIAGNOSIS — R42 Dizziness and giddiness: Secondary | ICD-10-CM | POA: Diagnosis not present

## 2017-12-31 LAB — POCT I-STAT, CHEM 8
BUN: 8 mg/dL (ref 6–20)
CHLORIDE: 102 mmol/L (ref 98–111)
Calcium, Ion: 1.2 mmol/L (ref 1.15–1.40)
Creatinine, Ser: 0.6 mg/dL (ref 0.44–1.00)
GLUCOSE: 94 mg/dL (ref 70–99)
HCT: 36 % (ref 36.0–46.0)
Hemoglobin: 12.2 g/dL (ref 12.0–15.0)
Potassium: 3.9 mmol/L (ref 3.5–5.1)
SODIUM: 140 mmol/L (ref 135–145)
TCO2: 25 mmol/L (ref 22–32)

## 2017-12-31 LAB — POCT URINALYSIS DIP (DEVICE)
Bilirubin Urine: NEGATIVE
GLUCOSE, UA: NEGATIVE mg/dL
Hgb urine dipstick: NEGATIVE
Ketones, ur: NEGATIVE mg/dL
LEUKOCYTES UA: NEGATIVE
NITRITE: NEGATIVE
PROTEIN: NEGATIVE mg/dL
SPECIFIC GRAVITY, URINE: 1.01 (ref 1.005–1.030)
Urobilinogen, UA: 0.2 mg/dL (ref 0.0–1.0)
pH: 7 (ref 5.0–8.0)

## 2017-12-31 MED ORDER — IPRATROPIUM BROMIDE 0.06 % NA SOLN: 2.0000 | Freq: Four times a day (QID) | NASAL | 0 refills | Status: DC

## 2017-12-31 MED ORDER — CETIRIZINE HCL 10 MG PO TABS: 10.0000 mg | ORAL_TABLET | Freq: Every day | ORAL | 0 refills | Status: DC

## 2017-12-31 NOTE — ED Notes (Signed)
Bed: UC01 Expected date:  Expected time:  Means of arrival:  Comments: 

## 2017-12-31 NOTE — ED Triage Notes (Signed)
[facility] a [facility] of [facility] sore throat and dizziness x 1 week.

## 2017-12-31 NOTE — Discharge Instructions (Signed)
Your lab work and EKG was normal. You may be a little dehydrated. Please try to increase fluid intake, your urine should be clear to pale yellow in color. Throat irritation could be from drainage down your throat. Start zyrtec and atrovent nasal spray as directed.  Monitor for worsening of symptoms, weakness, confusion, fever, passing out go to the emergency department for further evaluation.  Otherwise, follow-up with your PCP for further evaluation if symptoms not improving.

## 2017-12-31 NOTE — ED Provider Notes (Signed)
[facility]    CSN: [id number] Arrival date & time: 12/31/17  1814     History   Chief Complaint Chief Complaint  Patient presents with  . appointment 6:25  . Dizziness    HPI Julie Levy is a 27 y.o. female.   27 year old female comes in with 2-day history of lightheadedness.  States for the past week, when she yawns she has had some gagging and dry heaving without obvious nausea.  Denies vomiting.  Denies abdominal pain, diarrhea.  Starting 2 days ago she has had intermittent dizziness that she describes as lightheadedness with intermittent blurry vision.  This seems to improve after eating or taking a nap.  No obvious aggravating factor.  She is also complaining of a sore throat.  No obvious rhinorrhea, nasal congestion, cough.  Denies ear pain.  Denies fever, chills, night sweats.  Denies weakness, seizures, syncope.  Denies confusion/altered mental status.  Denies chest pain, shortness of breath, palpitations.  No long extended heat exposure.  No increase in activity.  Cough drops and honey with some relief.  Rare alcohol use, rare caffeine use.  Never smoker.  Denies illicit drug use.     History reviewed. No pertinent past medical history.  There are no active problems to display for this patient.   History reviewed. No pertinent surgical history.  OB History   None      Home Medications    Prior to Admission medications   Medication Sig Start Date End Date Taking? Authorizing Provider  cetirizine (ZYRTEC) 10 MG tablet Take 1 tablet (10 mg total) by mouth daily. 12/31/17   Cathie HoopsYu, Amy V, PA-C  ipratropium (ATROVENT) 0.06 % nasal spray Place 2 sprays into both nostrils 4 (four) times daily. 12/31/17   Belinda FisherYu, Amy V, PA-C    Family History Family History  Problem Relation Age of Onset  . Diabetes Father   . Hyperlipidemia Father     Social History Social History   Tobacco Use  . Smoking status: Never Smoker  . Smokeless tobacco: Never Used  Substance  Use Topics  . Alcohol use: Yes  . Drug use: No     Allergies   Patient has no known allergies.   Review of Systems Review of Systems  Reason unable to perform ROS: See HPI as above.     Physical Exam Triage Vital Signs ED Triage Vitals  Enc Vitals Group     BP 12/31/17 1833 127/73     Pulse Rate 12/31/17 1833 99     Resp 12/31/17 1833 16     Temp 12/31/17 1833 98.4 F (36.9 C)     Temp Source 12/31/17 1833 Oral     SpO2 12/31/17 1833 100 %     Weight --      Height --      Head Circumference --      Peak Flow --      Pain Score 12/31/17 1832 0     Pain Loc --      Pain Edu? --      Excl. in GC? --    Orthostatic VS for the past 24 hrs:  BP- Lying Pulse- Lying BP- Sitting Pulse- Sitting BP- Standing at 0 minutes Pulse- Standing at 0 minutes  12/31/17 1941 109/72 86 112/78 94 110/72 100    Updated Vital Signs BP 127/73 (BP Location: Left Arm)   Pulse 99   Temp 98.4 F (36.9 C) (Oral)   Resp 16   LMP  12/17/2017   SpO2 100%   Physical Exam  Constitutional: She is oriented to person, place, and time. She appears well-developed and well-nourished. No distress.  HENT:  Head: Normocephalic and atraumatic.  Right Ear: Tympanic membrane, external ear and ear canal normal. Tympanic membrane is not erythematous and not bulging.  Left Ear: External ear and ear canal normal. Tympanic membrane is erythematous. Tympanic membrane is not bulging.  Nose: Nose normal. Right sinus exhibits no maxillary sinus tenderness and no frontal sinus tenderness. Left sinus exhibits no maxillary sinus tenderness and no frontal sinus tenderness.  Mouth/Throat: Uvula is midline, oropharynx is clear and moist and mucous membranes are normal.  Eyes: Pupils are equal, round, and reactive to light. Conjunctivae, EOM and lids are normal.  Neck: Normal range of motion. Neck supple.  Cardiovascular: Normal rate, regular rhythm and normal heart sounds. Exam reveals no gallop and no friction rub.    No murmur heard. Pulmonary/Chest: Effort normal and breath sounds normal. She has no decreased breath sounds. She has no wheezes. She has no rhonchi. She has no rales.  Lymphadenopathy:    She has no cervical adenopathy.  Neurological: She is alert and oriented to person, place, and time. She has normal strength. She is not disoriented. No cranial nerve deficit or sensory deficit. She displays a negative Romberg sign. Coordination and gait normal. GCS eye subscore is 4. GCS verbal subscore is 5. GCS motor subscore is 6.  Normal finger-to-nose, rapid movement.  Patient is able to ambulate on and off exam table without difficulty.  Skin: Skin is warm and dry.  Psychiatric: She has a normal mood and affect. Her behavior is normal. Judgment normal.    UC Treatments / Results  Labs (all labs ordered are listed, but only abnormal results are displayed) Labs Reviewed  POCT I-STAT, CHEM 8  POCT URINALYSIS DIP (DEVICE)    EKG None  Radiology No results found.  Procedures Procedures (including critical care time)  Medications Ordered in UC Medications - No data to display  Initial Impression / Assessment and Plan / UC Course  I have reviewed the triage vital signs and the nursing notes.  Pertinent labs & imaging results that were available during my care of the patient were reviewed by me and considered in my medical decision making (see chart for details).    No alarming signs on exam.  I-STAT within normal limits.  Urine negative for urinary tract infection.  Neuro exam grossly intact without focal deficits.  EKG normal sinus rhythm, 95 bpm, no acute ST changes.  Will treat for postnasal drip, sinus pressure.  Have patient push fluids and monitor symptoms.  Strict return precautions given.  Patient expresses understanding and agrees to plan.  Final Clinical Impressions(s) / UC Diagnoses   Final diagnoses:  Lightheadedness    ED Prescriptions    Medication Sig Dispense Auth.  Provider   cetirizine (ZYRTEC) 10 MG tablet Take 1 tablet (10 mg total) by mouth daily. 15 tablet Yu, Amy V, PA-C   ipratropium (ATROVENT) 0.06 % nasal spray Place 2 sprays into both nostrils 4 (four) times daily. 15 mL Threasa Alpha, [place] 12/31/17 2100

## 2018-02-18 ENCOUNTER — Ambulatory Visit (HOSPITAL_COMMUNITY)
Admission: EM | Admit: 2018-02-18 | Discharge: 2018-02-18 | Disposition: A | Discharge status: Home / Self Care | Payer: PRIVATE HEALTH INSURANCE | Attending: Internal Medicine | Admitting: Internal Medicine

## 2018-02-18 ENCOUNTER — Encounter (HOSPITAL_COMMUNITY): Payer: Self-pay | Admitting: Emergency Medicine

## 2018-02-18 DIAGNOSIS — R350 Frequency of micturition: Secondary | ICD-10-CM | POA: Insufficient documentation

## 2018-02-18 DIAGNOSIS — Z202 Contact with and (suspected) exposure to infections with a predominantly sexual mode of transmission: Secondary | ICD-10-CM

## 2018-02-18 DIAGNOSIS — R102 Pelvic and perineal pain: Secondary | ICD-10-CM | POA: Insufficient documentation

## 2018-02-18 LAB — POCT URINALYSIS DIP (DEVICE)
BILIRUBIN URINE: NEGATIVE
GLUCOSE, UA: NEGATIVE mg/dL
KETONES UR: NEGATIVE mg/dL
Nitrite: NEGATIVE
PH: 7 (ref 5.0–8.0)
PROTEIN: 100 mg/dL — AB
SPECIFIC GRAVITY, URINE: 1.02 (ref 1.005–1.030)
Urobilinogen, UA: 0.2 mg/dL (ref 0.0–1.0)

## 2018-02-18 MED ORDER — NITROFURANTOIN MONOHYD MACRO 100 MG PO CAPS: 100.0000 mg | ORAL_CAPSULE | Freq: Two times a day (BID) | ORAL | 0 refills | Status: AC

## 2018-02-18 NOTE — Discharge Instructions (Addendum)
Recommend start Macrobid 100mg  twice a day for the next 5 days. Finish medication even if feeling better. Increase fluid intake. Follow-up pending urine culture results.

## 2018-02-18 NOTE — ED Provider Notes (Signed)
[facility]    CSN: [id number] Arrival date & time: 02/18/18  1819     History   Chief Complaint Chief Complaint  Patient presents with  . Appointment    607 (late)  . Urinary Tract Infection    HPI Julie Levy is a 27 y.o. female.   27 year old female presents with increased urinary frequency, urgency and lower abdominal pressure for the past 2 to 3 days. Also noticed urine is very cloudy and slight odor. Denies any fever, dysuria, unusual vaginal discharge or back pain. Recently sexually active with one partner and used a condom 5 days ago. No previous history of UTI. LMP 1 week ago. No unusual bleeding. No other chronic health issues. Takes no daily medication.   The history is provided by the patient.    History reviewed. No pertinent past medical history.  There are no active problems to display for this patient.   History reviewed. No pertinent surgical history.  OB History   None      Home Medications    Prior to Admission medications   Medication Sig Start Date End Date Taking? Authorizing Provider  nitrofurantoin, macrocrystal-monohydrate, (MACROBID) 100 MG capsule Take 1 capsule (100 mg total) by mouth 2 (two) times daily for 5 days. 02/18/18 02/23/18  Sudie Grumbling, NP    Family History Family History  Problem Relation Age of Onset  . Diabetes Father   . Hyperlipidemia Father     Social History Social History   Tobacco Use  . Smoking status: Never Smoker  . Smokeless tobacco: Never Used  Substance Use Topics  . Alcohol use: Yes  . Drug use: No     Allergies   Patient has no known allergies.   Review of Systems Review of Systems  Constitutional: Negative for activity change, appetite change, chills, fatigue and fever.  HENT: Negative for mouth sores, sore throat and trouble swallowing.   Respiratory: Negative for cough, chest tightness, shortness of breath and wheezing.   Cardiovascular: Negative for chest pain.    Gastrointestinal: Positive for abdominal pain (lower abdominal pressure). Negative for diarrhea, nausea and vomiting.  Genitourinary: Positive for decreased urine volume, frequency and urgency. Negative for difficulty urinating, dysuria, flank pain, genital sores, hematuria, pelvic pain, vaginal bleeding, vaginal discharge and vaginal pain.  Musculoskeletal: Negative for arthralgias, back pain and myalgias.  Skin: Negative for color change, rash and wound.  Allergic/Immunologic: Negative for immunocompromised state.  Neurological: Negative for dizziness, seizures, syncope, weakness, light-headedness, numbness and headaches.  Hematological: Negative for adenopathy. Does not bruise/bleed easily.     Physical Exam Triage Vital Signs ED Triage Vitals  Enc Vitals Group     BP 02/18/18 1828 101/65     Pulse Rate 02/18/18 1828 99     Resp 02/18/18 1828 18     Temp 02/18/18 1828 97.6 F (36.4 C)     Temp src --      SpO2 02/18/18 1828 99 %     Weight --      Height --      Head Circumference --      Peak Flow --      Pain Score 02/18/18 1829 0     Pain Loc --      Pain Edu? --      Excl. in GC? --    No data found.  Updated Vital Signs BP 101/65   Pulse 99   Temp 97.6 F (36.4 C)   Resp  18   LMP 02/11/2018   SpO2 99%   Visual Acuity Right Eye Distance:   Left Eye Distance:   Bilateral Distance:    Right Eye Near:   Left Eye Near:    Bilateral Near:     Physical Exam  Constitutional: She is oriented to person, place, and time. Vital signs are normal. She appears well-developed and well-nourished. She is cooperative. She does not appear ill. No distress.  Patient sitting comfortably on exam table in no acute distress.   HENT:  Head: Normocephalic and atraumatic.  Eyes: Conjunctivae and EOM are normal.  Neck: Normal range of motion.  Cardiovascular: Normal rate, regular rhythm and normal heart sounds.  No murmur heard. Pulmonary/Chest: Effort normal and breath sounds  normal. No stridor. No respiratory distress. She has no wheezes. She has no rhonchi.  Abdominal: Soft. Normal appearance and bowel sounds are normal. She exhibits no distension, no fluid wave and no mass. There is no hepatosplenomegaly. There is tenderness in the suprapubic area. There is no rigidity, no rebound, no guarding and no CVA tenderness.  Genitourinary:  Genitourinary Comments: No pelvic exam performed. Patient decided to do vaginal  self-swab for STD testing.  Musculoskeletal: Normal range of motion.  Neurological: She is alert and oriented to person, place, and time.  Skin: Skin is warm and dry. No rash noted.  Psychiatric: She has a normal mood and affect. Her behavior is normal. Judgment and thought content normal.  Vitals reviewed.    UC Treatments / Results  Labs (all labs ordered are listed, but only abnormal results are displayed) Labs Reviewed  POCT URINALYSIS DIP (DEVICE) - Abnormal; Notable for the following components:      Result Value   Hgb urine dipstick LARGE (*)    Protein, ur 100 (*)    Leukocytes, UA LARGE (*)    All other components within normal limits  URINE CULTURE  CERVICOVAGINAL ANCILLARY ONLY    EKG None  Radiology No results found.  Procedures Procedures (including critical care time)  Medications Ordered in UC Medications - No data to display  Initial Impression / Assessment and Plan / UC Course  I have reviewed the triage vital signs and the nursing notes.  Pertinent labs & imaging results that were available during my care of the patient were reviewed by me and considered in my medical decision making (see chart for details).    Reviewed urinalysis results with patient- discussed that results are consistent with UTI but can be other causes of symptoms. Recommend start Macrobid 100mg  twice a day as directed. Urine sent for culture. Vaginal self-swab obtained for cervical ancillary testing. Recommend no intercourse for next 5 to 7  days. Encouraged to continue condom use for future sexual encounters. Increase fluid intake. Follow-up pending lab results and in 2 to 3 days if not improving.   Final Clinical Impressions(s) / UC Diagnoses   Final diagnoses:  Urinary frequency  Suprapubic discomfort  Potential exposure to STD     Discharge Instructions     Recommend start Macrobid 100mg  twice a day for the next 5 days. Finish medication even if feeling better. Increase fluid intake. Follow-up pending urine culture results.     ED Prescriptions    Medication Sig Dispense Auth. Provider   nitrofurantoin, macrocrystal-monohydrate, (MACROBID) 100 MG capsule Take 1 capsule (100 mg total) by mouth 2 (two) times daily for 5 days. 10 capsule Sudie Grumbling, NP     Controlled Substance Prescriptions [place] Controlled  Substance Registry consulted? Not Applicable   Sudie Grumblingmyot, Marilu Rylander Berry, NP 02/18/18 2053

## 2018-02-18 NOTE — ED Triage Notes (Signed)
Pt states she thinks she has a UTI, c/o cloudy urine, urinary frequency.

## 2018-02-21 ENCOUNTER — Telehealth (HOSPITAL_COMMUNITY): Payer: Self-pay

## 2018-02-21 LAB — CERVICOVAGINAL ANCILLARY ONLY
BACTERIAL VAGINITIS: POSITIVE — AB
CANDIDA VAGINITIS: NEGATIVE
Chlamydia: NEGATIVE
NEISSERIA GONORRHEA: NEGATIVE
TRICH (WINDOWPATH): NEGATIVE

## 2018-02-21 LAB — URINE CULTURE
Culture: >=100000 — AB
Special Requests: NORMAL

## 2018-02-21 MED ORDER — METRONIDAZOLE 500 MG PO TABS: 500.0000 mg | ORAL_TABLET | Freq: Two times a day (BID) | ORAL | 0 refills | Status: DC

## 2018-02-21 NOTE — Telephone Encounter (Signed)
Bacterial vaginosis is positive. This was not treated at the urgent care visit. Flagyl 500 mg BID x 7 days #14 no refills sent to patients pharmacy of choice.  Attempted to reach patient, no answer at this time.  

## 2018-02-21 NOTE — Telephone Encounter (Signed)
Urine culture positive for E.coli. This was treated with Macrobid at urgent care visit. Attempted to reach patient. No answer at this time.

## 2018-04-01 IMAGING — DX DG CHEST 2V
2 series · 2 of 2 positions shown · non-contrast
Comparison: None.

CLINICAL DATA: Possible sepsis. Recent diagnosis of strep throat.
Fever and sore throat.

EXAM:
CHEST  2 VIEW

[w chest pa]
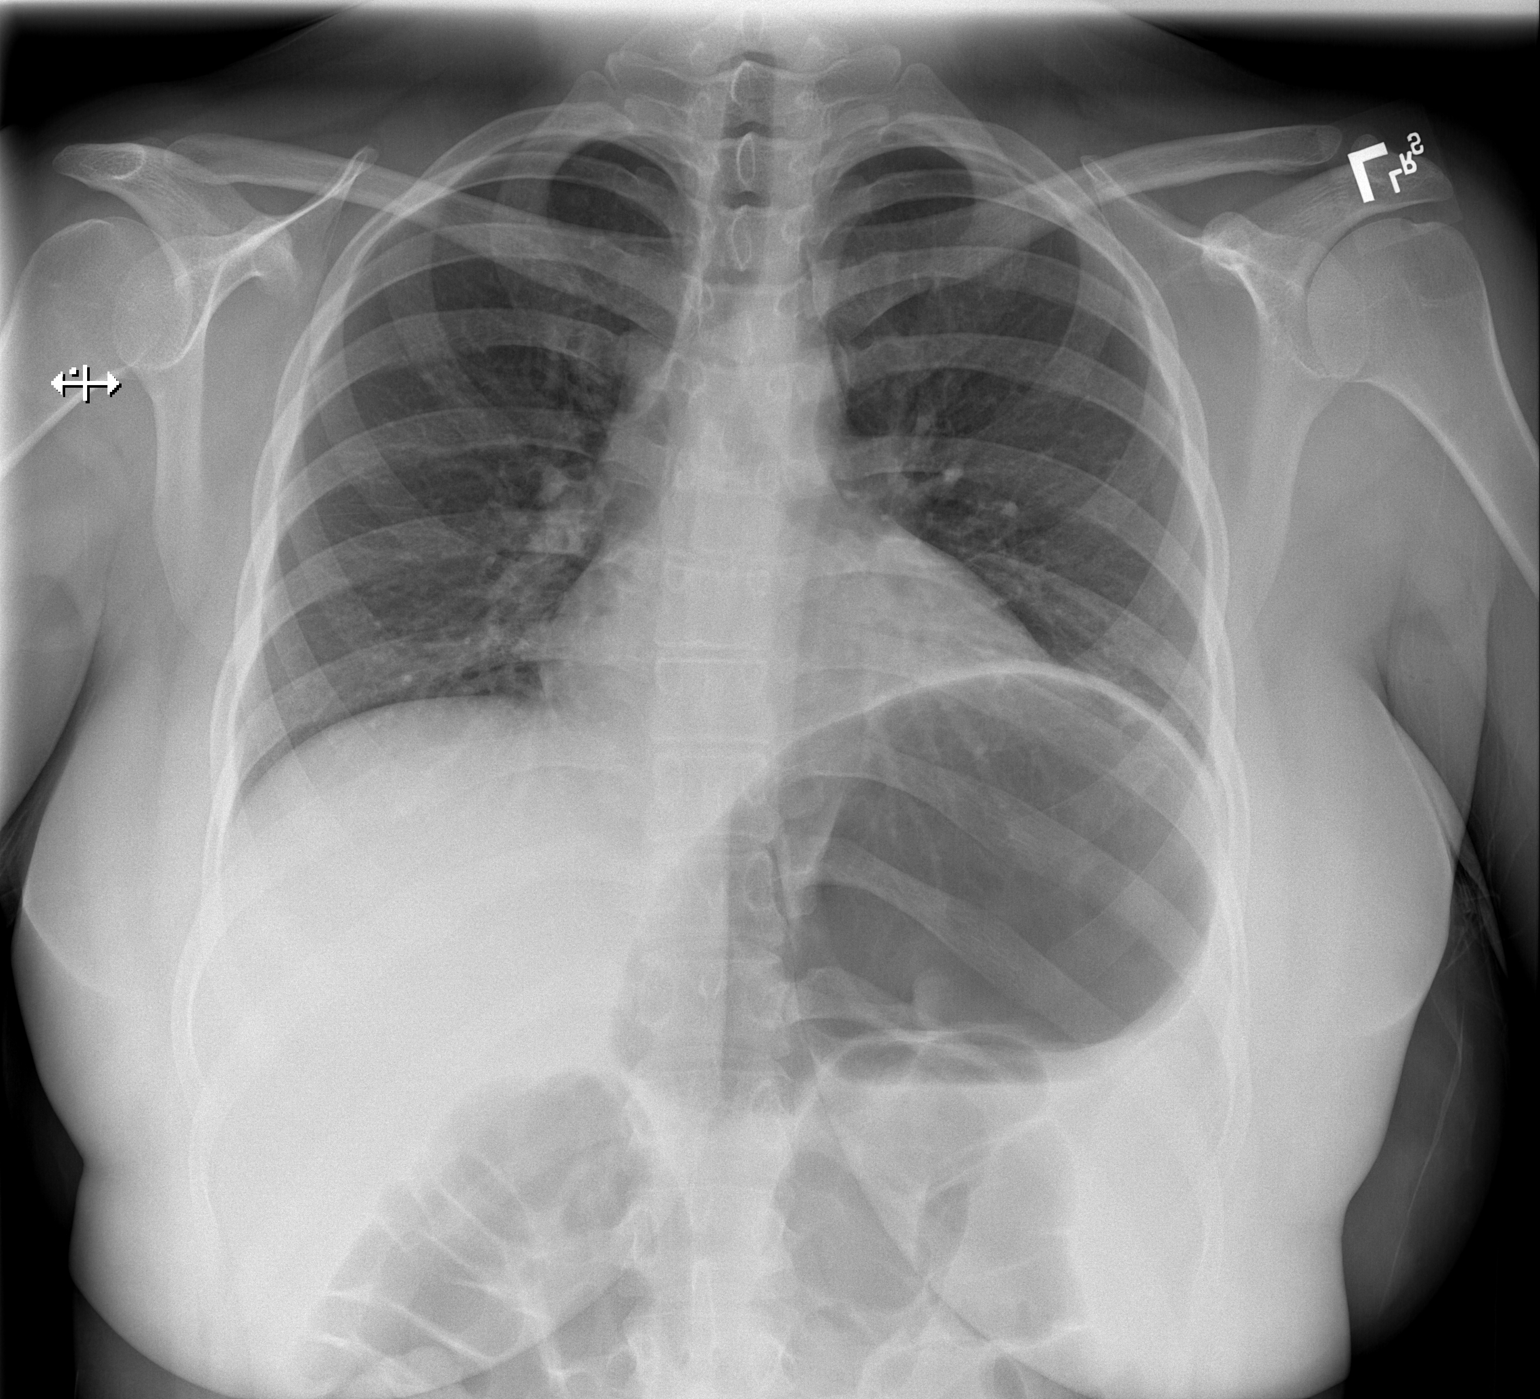

[w chest lat]
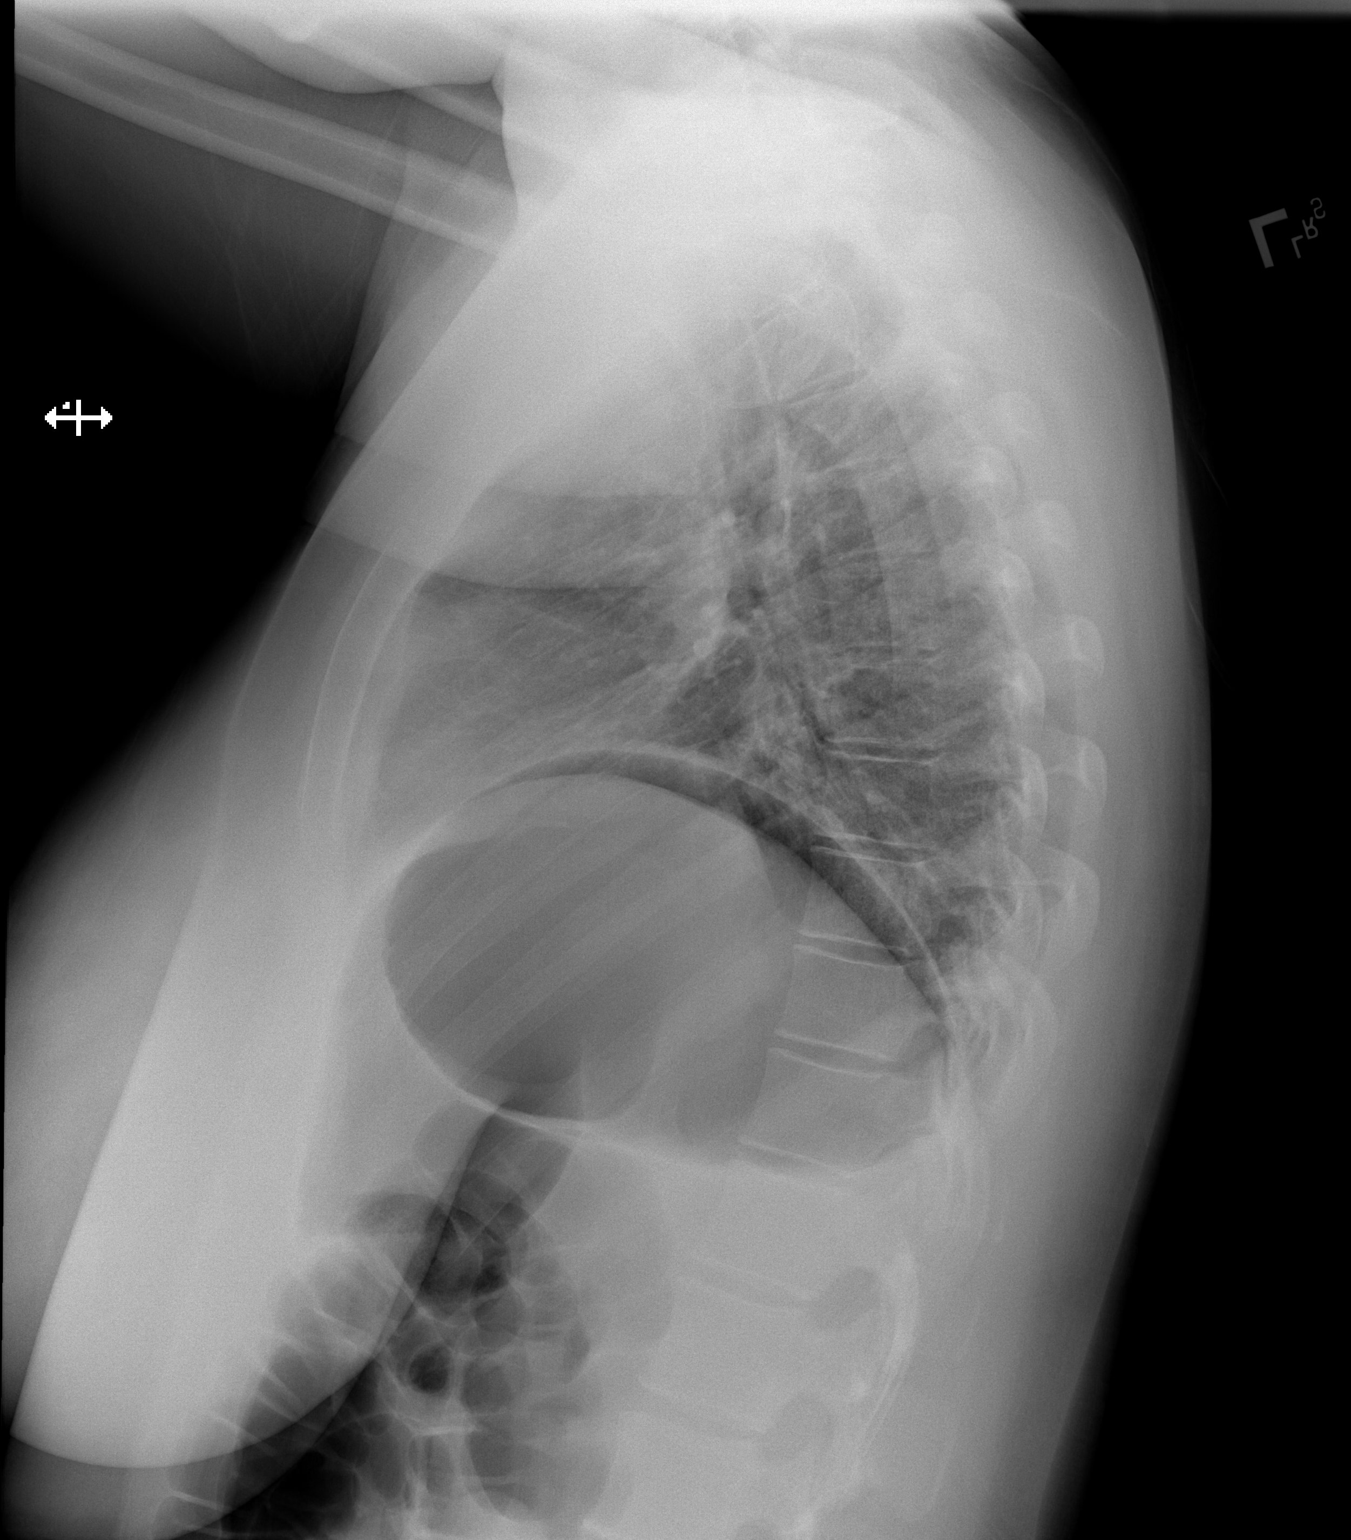

[2 of 2 positions shown; findings below may reference images not displayed]

FINDINGS: Low lung volumes. The cardiomediastinal contours are normal. The
lungs are clear. Pulmonary vasculature is normal. No consolidation,
pleural effusion, or pneumothorax. No acute osseous abnormalities
are seen. Gaseous gastric distention incidentally noted in the upper
abdomen.
IMPRESSION: Low lung volumes without acute chest finding.

## 2018-08-24 ENCOUNTER — Other Ambulatory Visit: Payer: Self-pay

## 2018-08-24 ENCOUNTER — Ambulatory Visit (INDEPENDENT_AMBULATORY_CARE_PROVIDER_SITE_OTHER): Payer: PRIVATE HEALTH INSURANCE

## 2018-08-24 ENCOUNTER — Ambulatory Visit: Payer: PRIVATE HEALTH INSURANCE | Admitting: Family Medicine

## 2018-08-24 ENCOUNTER — Encounter: Payer: Self-pay | Admitting: Family Medicine

## 2018-08-24 VITALS — BP 131/82 | HR 89 | Temp 97.5°F | Ht 61.0 in | Wt 158.0 lb

## 2018-08-24 DIAGNOSIS — M7989 Other specified soft tissue disorders: Secondary | ICD-10-CM

## 2018-08-24 NOTE — Patient Instructions (Addendum)
   If you have lab work done today you will be contacted with your lab results within the next 2 weeks.  If you have not heard from us then please contact us. The fastest way to get your results is to register for My Chart.   IF you received an x-ray today, you will receive an invoice from [facility]. Please contact [facility] at [phone] with questions or concerns regarding your invoice.   IF you received labwork today, you will receive an invoice from [facility]. Please contact [facility] at [phone] with questions or concerns regarding your invoice.   Our billing staff will not be able to assist you with questions regarding bills from these companies.  You will be contacted with the lab results as soon as they are available. The fastest way to get your results is to activate your My Chart account. Instructions are located on the last page of this paperwork. If you have not heard from us regarding the results in 2 weeks, please contact this office.      Hematoma A hematoma is a collection of blood under the skin, in an organ, in a body space, in a joint space, or in other tissue. The blood can thicken (clot) to form a lump that you can see and feel. The lump is often firm and may become sore and tender. Most hematomas get better in a few days to weeks. However, some hematomas may be serious and require medical care. Hematomas can range from very small to very large. What are the causes? This condition is caused by:  A blunt or penetrating injury.  A leakage from a blood vessel under the skin.  Some medical procedures, including surgeries, such as oral surgery, face lifts, and surgeries on the joints.  Some medical conditions that cause bleeding or bruising. There may be multiple hematomas that appear in different areas of the body. What increases the risk? You are more likely to develop this condition if:  You are an older adult.  You use blood  thinners. What are the signs or symptoms?  Symptoms of this condition depend on where the hematoma is located.  Common symptoms of a hematoma that is under the skin include:  A firm lump on the body.  Pain and tenderness in the area.  Bruising. Blue, dark blue, purple-red, or yellowish skin (discoloration) may appear at the site of the hematoma if the hematoma is close to the surface of the skin. Common symptoms of a hematoma that is deep in the tissues or body spaces may be less obvious. They include:  A collection of blood in the stomach (intra-abdominal hematoma). This may cause pain in the abdomen, weakness, fainting, and shortness of breath.  A collection of blood in the head (intracranial hematoma). This may cause a headache or symptoms such as weakness, trouble speaking or understanding, or a change in consciousness. How is this diagnosed? This condition is diagnosed based on:  Your medical history.  A physical exam.  Imaging tests, such as an ultrasound or CT scan. These may be needed if your health care provider suspects a hematoma in deeper tissues or body spaces.  Blood tests. These may be needed if your health care provider believes that the hematoma is caused by a medical condition. How is this treated? Treatment for this condition depends on the cause, size, and location of the hematoma. Treatment may include:  Doing nothing. The majority of hematomas do not need treatment as many   of them go away on their own over time.  Surgery or close monitoring. This may be needed for large hematomas or hematomas that affect vital organs.  Medicines. Medicines may be given if there is an underlying medical cause for the hematoma. Follow these instructions at home: Managing pain, stiffness, and swelling   If directed, put ice on the affected area. ? Put ice in a plastic bag. ? Place a towel between your skin and the bag. ? Leave the ice on for 20 minutes, 2-3 times a day for  the first couple of days.  If directed, apply heat to the affected area after applying ice for a couple of days. Use the heat source that your health care provider recommends, such as a moist heat pack or a heating pad. ? Place a towel between your skin and the heat source. ? Leave the heat on for 20-30 minutes. ? Remove the heat if your skin turns bright red. This is especially important if you are unable to feel pain, heat, or cold. You may have a greater risk of getting burned.  Raise (elevate) the affected area above the level of your heart while you are sitting or lying down.  If told, wrap the affected area with an elastic bandage. The bandage applies pressure (compression) to the area, which may help to reduce swelling and promote healing. Do not wrap the bandage too tightly around the affected area.  If your hematoma is on a leg or foot (lower extremity) and is painful, your health care provider may recommend crutches. Use them as told by your health care provider. General instructions  Take over-the-counter and prescription medicines only as told by your health care provider.  Keep all follow-up visits as told by your health care provider. This is important. Contact a health care provider if:  You have a fever.  The swelling or discoloration gets worse.  You develop more hematomas. Get help right away if:  Your pain is worse or your pain is not controlled with medicine.  Your skin over the hematoma breaks or starts bleeding.  Your hematoma is in your chest or abdomen and you have weakness, shortness of breath, or a change in consciousness.  You have a hematoma on your scalp that is caused by a fall or injury, and you also have: ? A headache that gets worse. ? Trouble speaking or understanding speech. ? Weakness. ? Change in alertness or consciousness. Summary  A hematoma is a collection of blood under the skin, in an organ, in a body space, in a joint space, or in  other tissue.  This condition usually does not need treatment because many hematomas go away on their own over time.  Large hematomas, or those that may affect vital organs, may need surgical drainage or monitoring. If the hematoma is caused by a medical condition, medicines may be prescribed.  Get help right away if your hematoma breaks or starts to bleed, you have shortness of breath, or you have a headache or trouble speaking after a fall. This information is not intended to replace advice given to you by your health care provider. Make sure you discuss any questions you have with your health care provider. Document Released: 01/28/2004 Document Revised: 11/18/2017 Document Reviewed: 11/18/2017 Elsevier Interactive Patient Education  2019 Elsevier Inc.  

## 2018-08-24 NOTE — Progress Notes (Signed)
   2/26/20203:34 PM  Julie Levy Nov 17, 1990, 28 y.o. female [id number]  Chief Complaint  Patient presents with  . Pain    pain in the left arm, say arm became stuck in the car window while reaching out. injury happened yesterday    HPI:   Patient is a 28 y.o. female who presents today for left arm pain  She was handing over something to her fiance thru her car window so her hand got stuck in his coat pocket Became very swollen has been icing, has not taken any NSAIDs She has fractured that arm twice as as a child Painful with certain movements, like using her steering wheel Denies any numbness or tingling Right handed  Fall Risk  08/24/2018 10/06/2017 10/06/2017 07/08/2016 01/13/2016  Falls in the past year? 0 Yes No No No  Number falls in past yr: 0 1 - - -  Injury with Fall? 0 (No Data) - - -  Comment - pt hurt both knees, bruises and scratches - - -     Depression screen [facility] 2/9 08/24/2018 10/06/2017 10/06/2017  Decreased Interest 0 0 0  Down, Depressed, Hopeless 0 0 0  PHQ - 2 Score 0 0 0    No Known Allergies  Prior to Admission medications   Not on File    History reviewed. No pertinent past medical history.  History reviewed. No pertinent surgical history.  Social History   Tobacco Use  . Smoking status: Never Smoker  . Smokeless tobacco: Never Used  Substance Use Topics  . Alcohol use: Yes    Family History  Problem Relation Age of Onset  . Diabetes Father   . Hyperlipidemia Father     ROS Per hpi  OBJECTIVE:  Blood pressure 131/82, pulse 89, temperature (!) 97.5 F (36.4 C), temperature source Oral, height 5\' 1"  (1.549 m), weight 158 lb (71.7 kg), SpO2 100 %. Body mass index is 29.85 kg/m.   Physical Exam  Gen: AAOx3, NAD Left arm: FROM, swelling and bruising along distal forearm, just proximal to medial condyle, NVI   Dg Humerus Left  Result Date: 08/24/2018 CLINICAL DATA:  swelling and bruising after arm getting caught in car window  yesterday EXAM: LEFT HUMERUS - 2+ VIEW COMPARISON:  None. FINDINGS: There is no evidence of fracture or other focal bone lesions. No radiodense foreign body. Soft tissues are unremarkable. IMPRESSION: Negative. Electronically Signed   By: Corlis Leak M.D.   On: 08/24/2018 16:14     ASSESSMENT and PLAN  1. Left arm swelling Discussed with patient normal xray. Conservative measures of wrap, ice, nasids, elevation for hematoma. RTC precautions reviewed - DG Humerus Left; Future  Return if symptoms worsen or fail to improve.    Myles Lipps, MD Primary Care at [facility] [address] [place], [place] [address] Ph.  [phone] Fax [phone]

## 2018-11-25 ENCOUNTER — Ambulatory Visit: Payer: Self-pay

## 2018-11-25 NOTE — Telephone Encounter (Signed)
Incoming call from pt.stating than  that she has felt tighten of her throat once. Yesterday and once today.  Patient has a history of tosilitis.  Patient.   Reason for Disposition . [1] MILD longstanding difficulty breathing AND [2]  SAME as normal  Answer Assessment - Initial Assessment Questions 1. RESPIRATORY STATUS: "Describe your breathing?" (e.g., wheezing, shortness of breath, unable to speak, severe coughing)      SOB 2. ONSET: "When did this breathing problem begin?"      yesterday 3. PATTERN "Does the difficult breathing come and go, or has it been constant since it started?"     Comes and goes  One yesterday once today.  4. SEVERITY: "How bad is your breathing?" (e.g., mild, moderate, severe)    - MILD: No SOB at rest, mild SOB with walking, speaks normally in sentences, can lay down, no retractions, pulse < 100.    - MODERATE: SOB at rest, SOB with minimal exertion and prefers to sit, cannot lie down flat, speaks in phrases, mild retractions, audible wheezing, pulse 100-120.    - SEVERE: Very SOB at rest, speaks in single words, struggling to breathe, sitting hunched forward, retractions, pulse > 120      moderate 5. RECURRENT SYMPTOM: "Have you had difficulty breathing before?" If so, ask: "When was the last time?" and "What happened that time?"      Yesterday drank water it helped  once before  6. CARDIAC HISTORY: "Do you have any history of heart disease?" (e.g., heart attack, angina, bypass surgery, angioplasty)      denies 7. LUNG HISTORY: "Do you have any history of lung disease?"  (e.g., pulmonary embolus, asthma, emphysema)     denies 8. CAUSE: "What do you think is causing the breathing problem?"      *No Answer* 9. OTHER SYMPTOMS: "Do you have any other symptoms? (e.g., dizziness, runny nose, cough, chest pain, fever)    denies10. PREGNANCY: "Is there any chance you are pregnant?" "When was your last menstrual period?"       Birth control   3 weeks ago very light.    11. TRAVEL: "Have you traveled out of the country in the last month?" (e.g., travel history, exposures)      denies  Protocols used: BREATHING DIFFICULTY-A-AH

## 2018-11-25 NOTE — Telephone Encounter (Signed)
States that she drinks some water it helped  Relieved the tightness.  Today she ate some almonds that helped relieved  The tightness.  Pt states that sometimes SOB occurs with it.  It comes and goes.Denies a hx of cardiac and lung hx.    Encouraged patient to go to Urgent care if it occurs over the weekend.  Patient voiced understanding. Denies any other Sx.

## 2018-12-06 ENCOUNTER — Other Ambulatory Visit: Payer: Self-pay

## 2018-12-06 ENCOUNTER — Telehealth: Payer: PRIVATE HEALTH INSURANCE | Admitting: Family Medicine

## 2019-01-14 ENCOUNTER — Other Ambulatory Visit: Payer: Self-pay

## 2019-01-14 ENCOUNTER — Ambulatory Visit (HOSPITAL_COMMUNITY)
Admission: EM | Admit: 2019-01-14 | Discharge: 2019-01-14 | Disposition: A | Discharge status: Home / Self Care | Payer: BC Managed Care – PPO | Attending: Family Medicine | Admitting: Family Medicine

## 2019-01-14 ENCOUNTER — Encounter (HOSPITAL_COMMUNITY): Payer: Self-pay | Admitting: Emergency Medicine

## 2019-01-14 DIAGNOSIS — B850 Pediculosis due to Pediculus humanus capitis: Secondary | ICD-10-CM | POA: Diagnosis not present

## 2019-01-14 MED ORDER — PERMETHRIN 1 % EX LIQD: Freq: Once | CUTANEOUS | 0 refills | Status: AC

## 2019-01-14 NOTE — ED Triage Notes (Signed)
Pt saw bugs in her hair Thursday used Nix but still itching and said she seen more.

## 2019-01-14 NOTE — ED Provider Notes (Signed)
[facility]    CSN: [id number] Arrival date & time: 01/14/19  1103     History   Chief Complaint Chief Complaint  Patient presents with  . Head Lice    HPI Julie Levy is a 28 y.o. female with history of dandruff present for acute concern of lice.  Patient states she has had occipital scalp itching since Thursday.  Patient by next over-the-counter and tried coming it out a few days ago, though feels it has worsened since then.    History reviewed. No pertinent past medical history.  There are no active problems to display for this patient.   History reviewed. No pertinent surgical history.  OB History   No obstetric history on file.      Home Medications    Prior to Admission medications   Medication Sig Start Date End Date Taking? Authorizing Provider  permethrin (NIX) 1 % liquid Apply topically once for 1 dose. May repeat in 7 days 01/14/19 01/14/19  Hall-Potvin, [place], PA-C    Family History Family History  Problem Relation Age of Onset  . Diabetes Father   . Hyperlipidemia Father     Social History Social History   Tobacco Use  . Smoking status: Never Smoker  . Smokeless tobacco: Never Used  Substance Use Topics  . Alcohol use: Yes  . Drug use: No     Allergies   Patient has no known allergies.   Review of Systems Review of Systems  Constitutional: Negative for appetite change, fatigue and fever.  Eyes: Negative for pain and redness.  Respiratory: Negative for cough and shortness of breath.   Cardiovascular: Negative for chest pain and palpitations.  Skin: Negative for rash and wound.       Scalp pruritis  Allergic/Immunologic: Negative for immunocompromised state.  Neurological: Negative for light-headedness and headaches.     Physical Exam Triage Vital Signs ED Triage Vitals [01/14/19 1129]  Enc Vitals Group     BP 120/68     Pulse Rate 90     Resp 16     Temp 98.7 F (37.1 C)     Temp Source Oral     SpO2 98  %     Weight      Height      Head Circumference      Peak Flow      Pain Score 0     Pain Loc      Pain Edu?      Excl. in [place]?    No data found.  Updated Vital Signs BP 120/68 (BP Location: Right Arm)   Pulse 90   Temp 98.7 F (37.1 C) (Oral)   Resp 16   SpO2 98%   Visual Acuity Right Eye Distance:   Left Eye Distance:   Bilateral Distance:    Right Eye Near:   Left Eye Near:    Bilateral Near:     Physical Exam Constitutional:      General: She is not in acute distress. HENT:     Head: Normocephalic and atraumatic.  Eyes:     General: No scleral icterus.    Pupils: Pupils are equal, round, and reactive to light.  Cardiovascular:     Rate and Rhythm: Normal rate.  Pulmonary:     Effort: Pulmonary effort is normal.  Skin:    Comments: Posterior scalp with few, scattered bites with surrounding erythema.  No open wounds, discharge.  Moderate amount of last eggs and hair strands.  No obvious/live lice identified.  Neurological:     Mental Status: She is alert and oriented to person, place, and time.      UC Treatments / Results  Labs (all labs ordered are listed, but only abnormal results are displayed) Labs Reviewed - No data to display  EKG   Radiology No results found.  Procedures Procedures (including critical care time)  Medications Ordered in UC Medications - No data to display  Initial Impression / Assessment and Plan / UC Course  I have reviewed the triage vital signs and the nursing notes.  Pertinent labs & imaging results that were available during my care of the patient were reviewed by me and considered in my medical decision making (see chart for details).     1.  Pediculosis capitis We will treat topically with permethrin.  Return precautions discussed, patient verbalized understanding and is agreeable to plan. Final Clinical Impressions(s) / UC Diagnoses   Final diagnoses:  Pediculosis capitis   Discharge Instructions   None     ED Prescriptions    Medication Sig Dispense Auth. Provider   permethrin (NIX) 1 % liquid Apply topically once for 1 dose. May repeat in 7 days 118 mL Hall-Potvin, [place], PA-C     Controlled Substance Prescriptions [place] Controlled Substance Registry consulted? Not Applicable   Shea EvansHall-Potvin, Brittany, [place] JerseyPA-C 01/14/19 1849

## 2019-01-14 NOTE — Discharge Instructions (Addendum)
Use nix as prescribed. May repeat in 7 days if needed. Return for worsening pain, itching, scalp pain, fever.

## 2019-03-01 ENCOUNTER — Ambulatory Visit (HOSPITAL_COMMUNITY)
Admission: EM | Admit: 2019-03-01 | Discharge: 2019-03-01 | Disposition: A | Discharge status: Home / Self Care | Payer: BC Managed Care – PPO | Attending: Family Medicine | Admitting: Family Medicine

## 2019-03-01 ENCOUNTER — Other Ambulatory Visit: Payer: Self-pay

## 2019-03-01 ENCOUNTER — Encounter (HOSPITAL_COMMUNITY): Payer: Self-pay

## 2019-03-01 DIAGNOSIS — R42 Dizziness and giddiness: Secondary | ICD-10-CM | POA: Insufficient documentation

## 2019-03-01 DIAGNOSIS — Z3202 Encounter for pregnancy test, result negative: Secondary | ICD-10-CM

## 2019-03-01 LAB — BASIC METABOLIC PANEL
Anion gap: 11 (ref 5–15)
BUN: 8 mg/dL (ref 6–20)
CO2: 22 mmol/L (ref 22–32)
Calcium: 9.3 mg/dL (ref 8.9–10.3)
Chloride: 106 mmol/L (ref 98–111)
Creatinine, Ser: 0.61 mg/dL (ref 0.44–1.00)
GFR calc Af Amer: >60 mL/min (ref >60)
GFR calc non Af Amer: >60 mL/min (ref >60)
Glucose, Bld: 76 mg/dL (ref 70–99)
Potassium: 4 mmol/L (ref 3.5–5.1)
Sodium: 139 mmol/L (ref 135–145)

## 2019-03-01 LAB — POCT URINALYSIS DIP (DEVICE)
Bilirubin Urine: NEGATIVE
Glucose, UA: NEGATIVE mg/dL
Hgb urine dipstick: NEGATIVE
Ketones, ur: NEGATIVE mg/dL
Leukocytes,Ua: NEGATIVE
Nitrite: NEGATIVE
Protein, ur: NEGATIVE mg/dL
Specific Gravity, Urine: >=1.03 (ref 1.005–1.030)
Urobilinogen, UA: 0.2 mg/dL (ref 0.0–1.0)
pH: 5.5 (ref 5.0–8.0)

## 2019-03-01 LAB — CBC
HCT: 43.4 % (ref 36.0–46.0)
Hemoglobin: 13.6 g/dL (ref 12.0–15.0)
MCH: 27.8 pg (ref 26.0–34.0)
MCHC: 31.3 g/dL (ref 30.0–36.0)
MCV: 88.6 fL (ref 80.0–100.0)
Platelets: 196 10*3/uL (ref 150–400)
RBC: 4.9 MIL/uL (ref 3.87–5.11)
RDW: 12.4 % (ref 11.5–15.5)
WBC: 10.8 10*3/uL — ABNORMAL HIGH (ref 4.0–10.5)
nRBC: 0 % (ref 0.0–0.2)

## 2019-03-01 LAB — POCT PREGNANCY, URINE: Preg Test, Ur: NEGATIVE

## 2019-03-01 NOTE — Discharge Instructions (Addendum)
You have been seen at the [facility] today for dizziness. Your evaluation today was not suggestive of any emergent condition requiring medical intervention at this time. Your ECG (heart tracing) did not show any worrisome changes. We are waiting on the results of your blood tests. Some medical problems make take more time to appear. Therefore, it's very important that you pay attention to any new symptoms or worsening of your current condition.  Please seek prompt medical care if: You have: A very bad (severe) headache that is not helped by medicine. Trouble walking or weakness in your arms and legs. Clear or bloody fluid coming from your nose or ears. Changes in your seeing (vision). Jerky movements that you cannot control (seizure). You throw up (vomit). Your symptoms get worse. You lose balance. Your speech is slurred. You pass out. You are sleepier and have trouble staying awake. The black centers of your eyes (pupils) change in size.  These symptoms may be an emergency. Do not wait to see if the symptoms will go away. Get medical help right away. Call your local emergency services. Do not drive yourself to the hospital.

## 2019-03-01 NOTE — ED Triage Notes (Signed)
Patient presents to Urgent Care with complaints of lightheadedness and occasional dizziness since last week after she got really drunk on her honeymoon in [place]. Patient reports the room feels like it is spinning.

## 2019-03-02 NOTE — ED Provider Notes (Signed)
[place]   [id number] 03/01/19 Arrival Time: 4098  ASSESSMENT & PLAN:  1. Dizziness   2. Lightheadedness     Normal neurologic exam. No head trauma. No suspicion for ICH or SAH. No signs of infection such a meningitis. Reassured. No indication for neurodiagnostic imaging at this time. Discussed.  EKG reviewed by me: NSR. No worrisome changes.  Pending: Orders Placed This Encounter  Procedures   CBC   Basic metabolic panel   Reassured that these symptoms do not appear to represent a serious or threatening condition. Reassured. Rest, avoid potentially dangerous activities (such as driving or working with machinery or at heights). Agrees to proceed to the ED should she develop other symptoms such as alterations of speech, swallowing, vision, motor/sensory systems, or if dizziness worsens. May f/u here otherwise if symptoms do not resolve over the next few days. Will contact her with any significant laboratory abnormalities.  Reviewed expectations re: course of current medical issues. Questions answered. Outlined signs and symptoms indicating need for more acute intervention. Patient verbalized understanding. After Visit Summary given.   SUBJECTIVE:  Julie Levy is a 28 y.o. female who presents for evaluation of dizziness described as lightheadedness with questionable mild vertigo at times. Current symptoms began several days ago and have waxed and waned but are better overall. Reports being in [place] and "drinking a lot" before symptoms started. Returned to the [place] several days ago. No associated respiratory symptoms/SOB/CP. Afebrile. Frequency of dizziness: "maybe a couple of times a day" with episodes lasting several minutes to a few hours. Aggravating factors: have not been identified. Positions that worsen symptoms: none identified. Denies coordination problems, gait problems, headaches, seizures, speech problems and weakness as well as aural pressure, otalgia,  tinnitus, or hearing loss; without visual changes. Recent infections: none. Head trauma: none. Drug ingestion: none. Noise exposure: none. No h/o similar reported. No sick contacts known. Afebrile. Normal PO intake. Sleeping well.  Social History   Tobacco Use  Smoking Status Never Smoker  Smokeless Tobacco Never Used   ROS: As per HPI. All other systems negative.    OBJECTIVE:  Vitals:   03/01/19 1831  BP: 128/64  Pulse: 84  Resp: 16  Temp: 98.3 F (36.8 C)  TempSrc: Oral  SpO2: 99%    General appearance: alert; no distress Eyes: PERRLA; EOMI; conjunctiva normal HENT: normocephalic; atraumatic; TMs normal; nasal mucosa normal; oral mucosa normal Neck: supple with FROM Lungs: clear to auscultation bilaterally Heart: regular rate and rhythm; no murmer Abdomen: soft, non-tender; bowel sounds normal Extremities: no cyanosis or edema; symmetrical with no gross deformities Skin: warm and dry Neurologic: normal gait; DTR's normal and symmetric; CN 2-12 grossly intact; rapid changes in position during the exam do not precipitate brief dizziness Psychological: alert and cooperative; normal mood and affect  Investigations: Results for orders placed or performed during the hospital encounter of 03/01/19  POCT urinalysis dip (device)  Result Value Ref Range   Glucose, UA NEGATIVE NEGATIVE mg/dL   Bilirubin Urine NEGATIVE NEGATIVE   Ketones, ur NEGATIVE NEGATIVE mg/dL   Specific Gravity, Urine >=1.030 1.005 - 1.030   Hgb urine dipstick NEGATIVE NEGATIVE   pH 5.5 5.0 - 8.0   Protein, ur NEGATIVE NEGATIVE mg/dL   Urobilinogen, UA 0.2 0.0 - 1.0 mg/dL   Nitrite NEGATIVE NEGATIVE   Leukocytes,Ua NEGATIVE NEGATIVE  Pregnancy, urine POC  Result Value Ref Range   Preg Test, Ur NEGATIVE NEGATIVE    No Known Allergies  PMH: "Healthy".  Social  History   Socioeconomic History   Marital status: Significant Other    Spouse name: Not on file   Number of children: Not on file     Years of education: Not on file   Highest education level: Not on file  Occupational History   Not on file  Social Needs   Financial resource strain: Not on file   Food insecurity    Worry: Not on file    Inability: Not on file   Transportation needs    Medical: Not on file    Non-medical: Not on file  Tobacco Use   Smoking status: Never Smoker   Smokeless tobacco: Never Used  Substance and Sexual Activity   Alcohol use: Yes    Comment: socially   Drug use: No   Sexual activity: Not on file  Lifestyle   Physical activity    Days per week: Not on file    Minutes per session: Not on file   Stress: Not on file  Relationships   Social connections    Talks on phone: Not on file    Gets together: Not on file    Attends religious service: Not on file    Active member of club or organization: Not on file    Attends meetings of clubs or organizations: Not on file    Relationship status: Not on file   Intimate partner violence    Fear of current or ex partner: Not on file    Emotionally abused: Not on file    Physically abused: Not on file    Forced sexual activity: Not on file  Other Topics Concern   Not on file  Social History Narrative   Not on file   Family History  Problem Relation Age of Onset   Diabetes Father    Hyperlipidemia Father    Healthy Mother    History reviewed. No pertinent surgical history.    Mardella LaymanHagler, Seleni Meller, MD 03/02/19 1023

## 2019-07-31 IMAGING — DX DG HUMERUS 2V *L*
2 series · 2 of 2 positions shown · non-contrast
Comparison: None.

CLINICAL DATA: swelling and bruising after arm getting caught in
car window yesterday

EXAM:
LEFT HUMERUS - 2+ VIEW

[humerus ap]
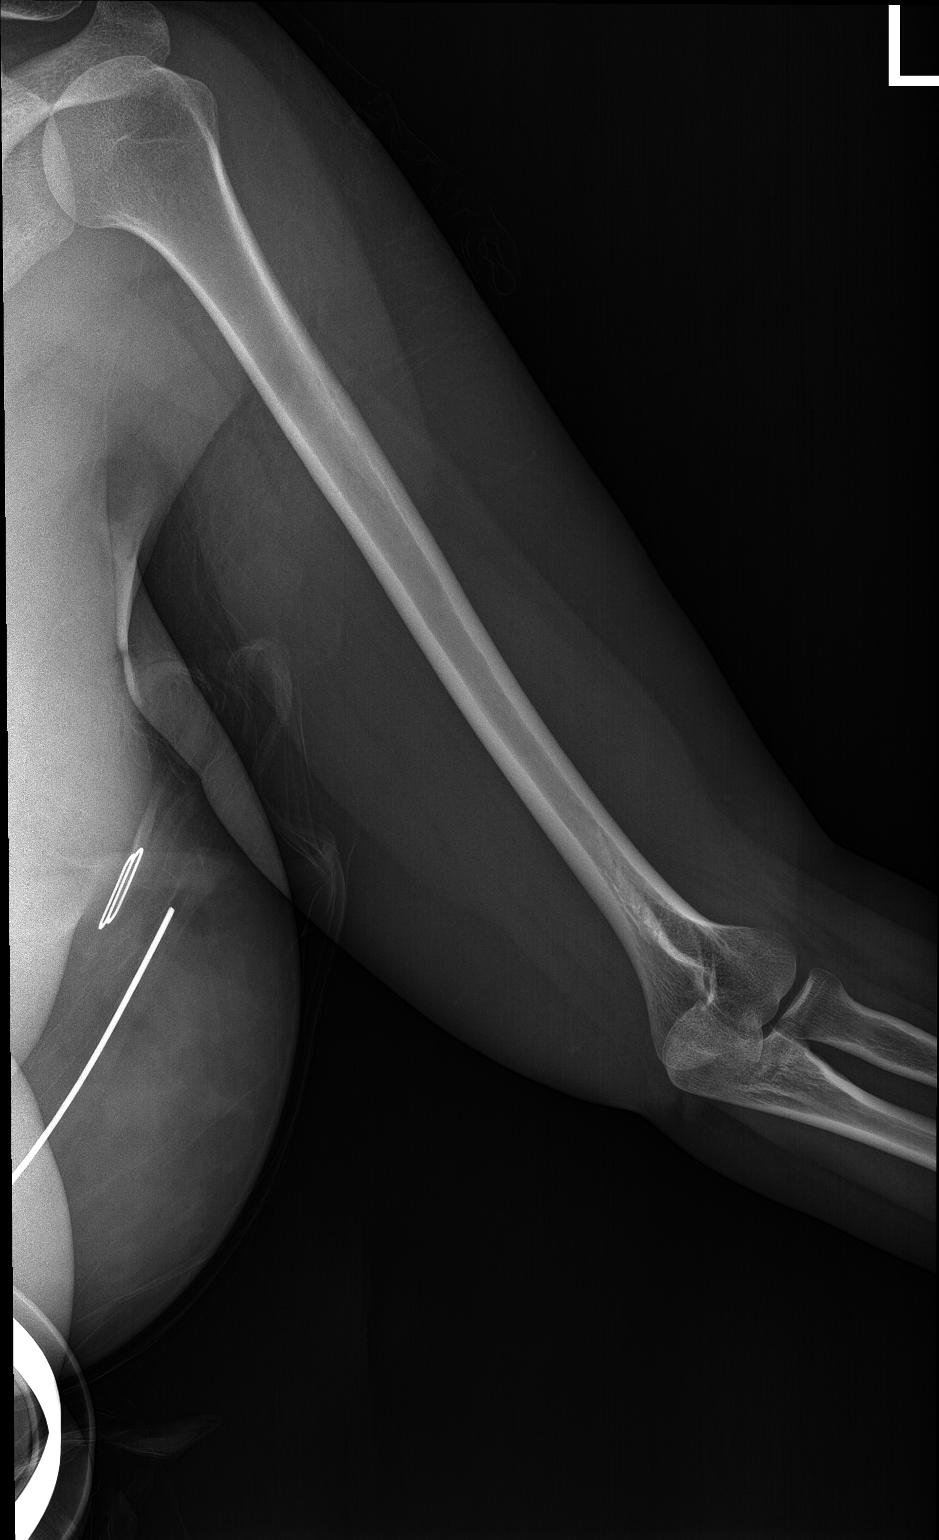

[humerus lat]
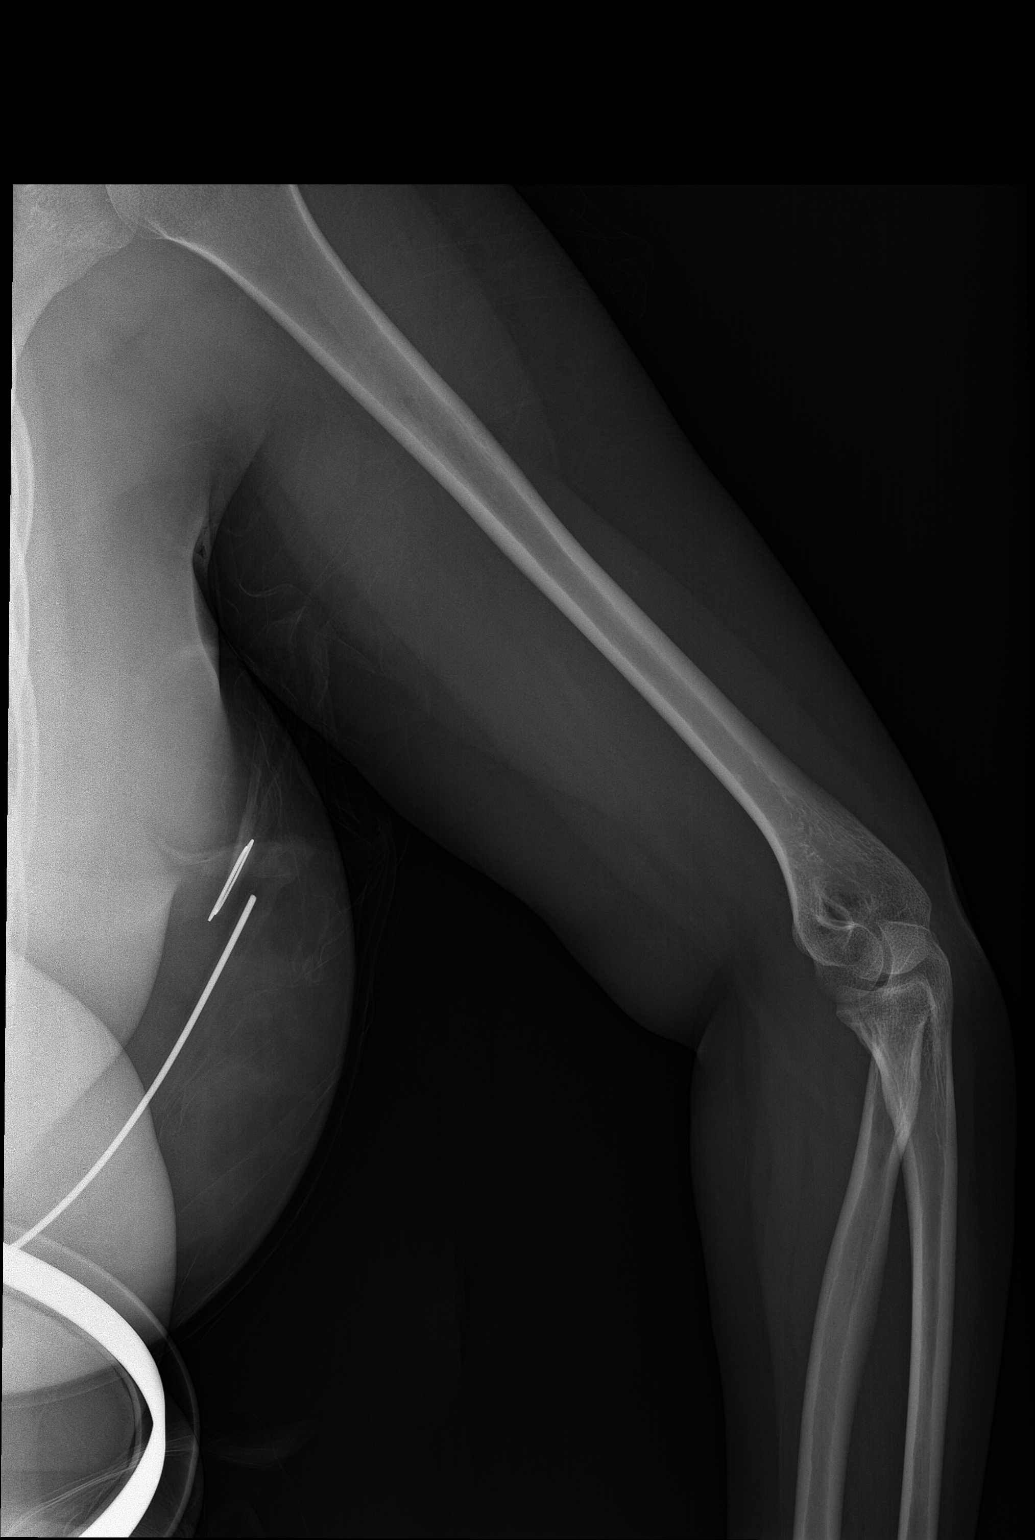

[2 of 2 positions shown; findings below may reference images not displayed]

FINDINGS: There is no evidence of fracture or other focal bone lesions. No
radiodense foreign body. Soft tissues are unremarkable.
IMPRESSION: Negative.
